# Patient Record
Sex: Female | Born: 1940 | Race: White | Hispanic: No | Smoking: Former smoker
Health system: Southern US, Community
[De-identification: ages and names within clinical notes are randomized; demographics above are authoritative.]

## PROBLEM LIST (undated history)

## (undated) DIAGNOSIS — K5130 Ulcerative (chronic) rectosigmoiditis without complications: Secondary | ICD-10-CM

## (undated) DIAGNOSIS — K5190 Ulcerative colitis, unspecified, without complications: Secondary | ICD-10-CM

## (undated) DIAGNOSIS — I10 Essential (primary) hypertension: Secondary | ICD-10-CM

## (undated) DIAGNOSIS — F329 Major depressive disorder, single episode, unspecified: Secondary | ICD-10-CM

## (undated) DIAGNOSIS — K760 Fatty (change of) liver, not elsewhere classified: Secondary | ICD-10-CM

## (undated) DIAGNOSIS — F32A Depression, unspecified: Secondary | ICD-10-CM

## (undated) DIAGNOSIS — K589 Irritable bowel syndrome without diarrhea: Secondary | ICD-10-CM

## (undated) DIAGNOSIS — R519 Headache, unspecified: Secondary | ICD-10-CM

## (undated) DIAGNOSIS — A045 Campylobacter enteritis: Secondary | ICD-10-CM

## (undated) DIAGNOSIS — I639 Cerebral infarction, unspecified: Secondary | ICD-10-CM

## (undated) DIAGNOSIS — M1991 Primary osteoarthritis, unspecified site: Secondary | ICD-10-CM

## (undated) DIAGNOSIS — N951 Menopausal and female climacteric states: Secondary | ICD-10-CM

## (undated) DIAGNOSIS — E785 Hyperlipidemia, unspecified: Secondary | ICD-10-CM

## (undated) DIAGNOSIS — T7840XA Allergy, unspecified, initial encounter: Secondary | ICD-10-CM

## (undated) DIAGNOSIS — R87610 Atypical squamous cells of undetermined significance on cytologic smear of cervix (ASC-US): Secondary | ICD-10-CM

## (undated) DIAGNOSIS — J45909 Unspecified asthma, uncomplicated: Secondary | ICD-10-CM

## (undated) DIAGNOSIS — J3089 Other allergic rhinitis: Secondary | ICD-10-CM

## (undated) DIAGNOSIS — Z800 Family history of malignant neoplasm of digestive organs: Secondary | ICD-10-CM

## (undated) DIAGNOSIS — K219 Gastro-esophageal reflux disease without esophagitis: Secondary | ICD-10-CM

## (undated) DIAGNOSIS — G4730 Sleep apnea, unspecified: Secondary | ICD-10-CM

## (undated) DIAGNOSIS — A0472 Enterocolitis due to Clostridium difficile, not specified as recurrent: Secondary | ICD-10-CM

## (undated) DIAGNOSIS — H269 Unspecified cataract: Secondary | ICD-10-CM

## (undated) DIAGNOSIS — J449 Chronic obstructive pulmonary disease, unspecified: Secondary | ICD-10-CM

## (undated) DIAGNOSIS — G8929 Other chronic pain: Secondary | ICD-10-CM

## (undated) HISTORY — DX: Headache, unspecified: R51.9

## (undated) HISTORY — DX: Chronic obstructive pulmonary disease, unspecified: J44.9

## (undated) HISTORY — DX: Fatty (change of) liver, not elsewhere classified: K76.0

## (undated) HISTORY — DX: Family history of malignant neoplasm of digestive organs: Z80.0

## (undated) HISTORY — DX: Depression, unspecified: F32.A

## (undated) HISTORY — PX: COLONOSCOPY: SHX174

## (undated) HISTORY — DX: Ulcerative (chronic) rectosigmoiditis without complications: K51.30

## (undated) HISTORY — DX: Campylobacter enteritis: A04.5

## (undated) HISTORY — DX: Irritable bowel syndrome, unspecified: K58.9

## (undated) HISTORY — DX: Gastro-esophageal reflux disease without esophagitis: K21.9

## (undated) HISTORY — PX: CHOLECYSTECTOMY: SHX55

## (undated) HISTORY — DX: Ulcerative colitis, unspecified, without complications: K51.90

## (undated) HISTORY — DX: Allergy, unspecified, initial encounter: T78.40XA

## (undated) HISTORY — DX: Unspecified asthma, uncomplicated: J45.909

## (undated) HISTORY — DX: Primary osteoarthritis, unspecified site: M19.91

## (undated) HISTORY — DX: Sleep apnea, unspecified: G47.30

## (undated) HISTORY — DX: Menopausal and female climacteric states: N95.1

## (undated) HISTORY — DX: Other chronic pain: G89.29

## (undated) HISTORY — DX: Other allergic rhinitis: J30.89

## (undated) HISTORY — PX: UPPER GASTROINTESTINAL ENDOSCOPY: SHX188

## (undated) HISTORY — DX: Hyperlipidemia, unspecified: E78.5

## (undated) HISTORY — PX: REPLACEMENT TOTAL KNEE: SUR1224

## (undated) HISTORY — PX: POLYPECTOMY: SHX149

## (undated) HISTORY — DX: Enterocolitis due to Clostridium difficile, not specified as recurrent: A04.72

## (undated) HISTORY — DX: Atypical squamous cells of undetermined significance on cytologic smear of cervix (ASC-US): R87.610

## (undated) HISTORY — DX: Morbid (severe) obesity due to excess calories: E66.01

## (undated) HISTORY — PX: TUBAL LIGATION: SHX77

## (undated) HISTORY — DX: Major depressive disorder, single episode, unspecified: F32.9

## (undated) HISTORY — DX: Essential (primary) hypertension: I10

## (undated) HISTORY — DX: Unspecified cataract: H26.9

## (undated) HISTORY — DX: Cerebral infarction, unspecified: I63.9

## (undated) HISTORY — PX: CATARACT EXTRACTION: SUR2

---

## 2008-03-09 ENCOUNTER — Ambulatory Visit: Payer: Self-pay | Admitting: Internal Medicine

## 2008-03-09 DIAGNOSIS — G4730 Sleep apnea, unspecified: Secondary | ICD-10-CM | POA: Insufficient documentation

## 2008-03-30 ENCOUNTER — Encounter: Payer: Self-pay | Admitting: Internal Medicine

## 2008-03-30 ENCOUNTER — Ambulatory Visit (HOSPITAL_BASED_OUTPATIENT_CLINIC_OR_DEPARTMENT_OTHER): Admission: RE | Admit: 2008-03-30 | Discharge: 2008-03-30 | Payer: Self-pay | Admitting: Internal Medicine

## 2008-04-04 ENCOUNTER — Ambulatory Visit: Payer: Self-pay | Admitting: Internal Medicine

## 2008-04-13 ENCOUNTER — Ambulatory Visit: Payer: Self-pay | Admitting: Internal Medicine

## 2008-04-25 ENCOUNTER — Encounter: Payer: Self-pay | Admitting: Internal Medicine

## 2008-04-29 ENCOUNTER — Encounter: Payer: Self-pay | Admitting: Internal Medicine

## 2008-05-01 ENCOUNTER — Encounter: Payer: Self-pay | Admitting: Internal Medicine

## 2008-05-05 ENCOUNTER — Encounter: Payer: Self-pay | Admitting: Internal Medicine

## 2008-05-25 ENCOUNTER — Ambulatory Visit: Payer: Self-pay | Admitting: Internal Medicine

## 2008-07-06 ENCOUNTER — Encounter: Payer: Self-pay | Admitting: Internal Medicine

## 2008-08-24 ENCOUNTER — Ambulatory Visit: Payer: Self-pay | Admitting: Internal Medicine

## 2009-02-22 ENCOUNTER — Ambulatory Visit: Payer: Self-pay | Admitting: Internal Medicine

## 2010-02-23 ENCOUNTER — Encounter: Payer: Self-pay | Admitting: Internal Medicine

## 2010-10-11 NOTE — Letter (Signed)
Summary: CMN for PAP Supplies/[facility]  CMN for PAP Supplies/[facility]   Imported By: Sherian Rein 03/16/2010 13:36:25  _____________________________________________________________________  External Attachment:    Type:   Image     Comment:   External Document

## 2011-01-24 NOTE — Procedures (Signed)
Emily Hancock, Emily Hancock                ACCOUNT NO.:  [id number]   MEDICAL RECORD NO.:  [id number]          PATIENT TYPE:  OUT   LOCATION:  SLEEP CENTER                 FACILITY:  [facility]   PHYSICIAN:  Clinton D. Maple Hudson, MD, FCCP, FACPDATE OF BIRTH:  May 14[birth year] 1942   DATE OF STUDY:  03/30/2008                            NOCTURNAL POLYSOMNOGRAM   REFERRING PHYSICIAN:  Clinton D. Young, MD, FCCP, FACP   INDICATION FOR STUDY:  Hypersomnia with sleep apnea.   EPWORTH SLEEPINESS SCORE:  11/24.  BMI 33, weight 199 pounds, height 63  inches, 15 inches.   MEDICATIONS:  Home medication charted and reviewed.   SLEEP ARCHITECTURE:  Total sleep time 277.5 minutes with sleep  efficiency 68.4%.  Stage I was 6.1%.  Stage II 84.9%.  Stage III 1.3%.  REM 7.7% of total sleep time.  Sleep latency 91.5 minutes.  REM latency  11.5 minutes.  Awake after sleep onset 36.5 minutes.  Arousal index  32.9.  Only Simvastatin taken at bedtime.   RESPIRATORY DATA:  Apnea/hypopnea index (AHI) 4.1 per hour.  Respiratory  disturbance index (RDI) 17.1 per hour with RERA count 60, index 13.  Nineteen events were scored including 2 obstructive apneas and 17  hypopneas.  All events occurred while supine.  REM AHI 5.6.  There were  insufficient events to permit CPAP titration by split protocol on this  study night.   OXYGEN DATA:  Moderate snoring with oxygen desaturation to a nadir of  84%.  Mean oxygen saturation through the study was 93.1% on room air.   CARDIAC DATA:  Normal sinus rhythm with occasional PVC.   MOVEMENT-PARASOMNIA:  Frequent limb jerks with a total of 209 counted,  averaging 45 per hour.  Of these, 29 were periodic limb movement with  arousal averaging 4.5 per hour.  No bathroom trips.   IMPRESSIONS-RECOMMENDATIONS:  1. Mild obstructive sleep apnea/hypopnea syndrome based on RDI 17.1      per hour, AHI 4.1 per hour (normal range 0-5 per hour).  All events      were associated with supine sleep, moderate  snoring with oxygen      desaturation to a nadir of 84%.  2. Scores in this range might be considered for a trial of CPAP if      conservative measures are insufficient.      Consider return for CPAP titration or evaluate for alternative      management as appropriate.  3. Periodic limb movement with arousal, 21 events, index 4.5 per hour.      Clinton D. Maple Hudson, MD, [facility], FACP  Diplomate, Biomedical engineer of Sleep Medicine  Electronically Signed     CDY/MEDQ  D:  04/04/2008 11:35:43  T:  04/04/2008 12:32:48  Job:  [id number]

## 2012-02-08 DIAGNOSIS — N951 Menopausal and female climacteric states: Secondary | ICD-10-CM | POA: Diagnosis not present

## 2012-02-08 DIAGNOSIS — M25569 Pain in unspecified knee: Secondary | ICD-10-CM | POA: Diagnosis not present

## 2012-02-08 DIAGNOSIS — R3915 Urgency of urination: Secondary | ICD-10-CM | POA: Diagnosis not present

## 2012-02-08 DIAGNOSIS — B351 Tinea unguium: Secondary | ICD-10-CM | POA: Diagnosis not present

## 2012-07-08 DIAGNOSIS — Z23 Encounter for immunization: Secondary | ICD-10-CM | POA: Diagnosis not present

## 2012-07-12 DIAGNOSIS — F329 Major depressive disorder, single episode, unspecified: Secondary | ICD-10-CM | POA: Diagnosis not present

## 2012-07-12 DIAGNOSIS — E7800 Pure hypercholesterolemia, unspecified: Secondary | ICD-10-CM | POA: Diagnosis not present

## 2012-07-12 DIAGNOSIS — N951 Menopausal and female climacteric states: Secondary | ICD-10-CM | POA: Diagnosis not present

## 2012-07-12 DIAGNOSIS — Z79899 Other long term (current) drug therapy: Secondary | ICD-10-CM | POA: Diagnosis not present

## 2012-08-30 DIAGNOSIS — Z800 Family history of malignant neoplasm of digestive organs: Secondary | ICD-10-CM | POA: Diagnosis not present

## 2012-08-30 DIAGNOSIS — R232 Flushing: Secondary | ICD-10-CM | POA: Diagnosis not present

## 2012-08-30 DIAGNOSIS — Z801 Family history of malignant neoplasm of trachea, bronchus and lung: Secondary | ICD-10-CM | POA: Diagnosis not present

## 2012-08-30 DIAGNOSIS — Z87891 Personal history of nicotine dependence: Secondary | ICD-10-CM | POA: Diagnosis not present

## 2012-09-05 DIAGNOSIS — K5130 Ulcerative (chronic) rectosigmoiditis without complications: Secondary | ICD-10-CM | POA: Diagnosis not present

## 2013-01-02 DIAGNOSIS — R232 Flushing: Secondary | ICD-10-CM | POA: Diagnosis not present

## 2013-01-03 DIAGNOSIS — J019 Acute sinusitis, unspecified: Secondary | ICD-10-CM | POA: Diagnosis not present

## 2013-01-03 DIAGNOSIS — M25569 Pain in unspecified knee: Secondary | ICD-10-CM | POA: Diagnosis not present

## 2013-01-03 DIAGNOSIS — Z0000 Encounter for general adult medical examination without abnormal findings: Secondary | ICD-10-CM | POA: Diagnosis not present

## 2013-01-10 DIAGNOSIS — M25569 Pain in unspecified knee: Secondary | ICD-10-CM | POA: Diagnosis not present

## 2013-06-02 DIAGNOSIS — H04129 Dry eye syndrome of unspecified lacrimal gland: Secondary | ICD-10-CM | POA: Diagnosis not present

## 2013-06-02 DIAGNOSIS — Z961 Presence of intraocular lens: Secondary | ICD-10-CM | POA: Diagnosis not present

## 2013-06-02 DIAGNOSIS — H43819 Vitreous degeneration, unspecified eye: Secondary | ICD-10-CM | POA: Diagnosis not present

## 2013-07-11 DIAGNOSIS — M25569 Pain in unspecified knee: Secondary | ICD-10-CM | POA: Diagnosis not present

## 2013-07-14 DIAGNOSIS — Z23 Encounter for immunization: Secondary | ICD-10-CM | POA: Diagnosis not present

## 2013-07-18 DIAGNOSIS — M25569 Pain in unspecified knee: Secondary | ICD-10-CM | POA: Diagnosis not present

## 2013-07-21 DIAGNOSIS — K219 Gastro-esophageal reflux disease without esophagitis: Secondary | ICD-10-CM | POA: Diagnosis not present

## 2013-07-21 DIAGNOSIS — Z006 Encounter for examination for normal comparison and control in clinical research program: Secondary | ICD-10-CM | POA: Diagnosis not present

## 2013-07-21 DIAGNOSIS — E7800 Pure hypercholesterolemia, unspecified: Secondary | ICD-10-CM | POA: Diagnosis not present

## 2013-07-21 DIAGNOSIS — J45909 Unspecified asthma, uncomplicated: Secondary | ICD-10-CM | POA: Diagnosis not present

## 2013-08-15 DIAGNOSIS — K219 Gastro-esophageal reflux disease without esophagitis: Secondary | ICD-10-CM | POA: Diagnosis not present

## 2013-08-15 DIAGNOSIS — E669 Obesity, unspecified: Secondary | ICD-10-CM | POA: Diagnosis not present

## 2013-08-15 DIAGNOSIS — F329 Major depressive disorder, single episode, unspecified: Secondary | ICD-10-CM | POA: Diagnosis not present

## 2013-08-15 DIAGNOSIS — Z79899 Other long term (current) drug therapy: Secondary | ICD-10-CM | POA: Diagnosis not present

## 2013-08-15 DIAGNOSIS — E7800 Pure hypercholesterolemia, unspecified: Secondary | ICD-10-CM | POA: Diagnosis not present

## 2013-08-15 DIAGNOSIS — N951 Menopausal and female climacteric states: Secondary | ICD-10-CM | POA: Diagnosis not present

## 2013-09-16 DIAGNOSIS — H1030 Unspecified acute conjunctivitis, unspecified eye: Secondary | ICD-10-CM | POA: Diagnosis not present

## 2013-09-16 DIAGNOSIS — N3941 Urge incontinence: Secondary | ICD-10-CM | POA: Diagnosis not present

## 2013-09-16 DIAGNOSIS — L719 Rosacea, unspecified: Secondary | ICD-10-CM | POA: Diagnosis not present

## 2013-10-14 DIAGNOSIS — K5130 Ulcerative (chronic) rectosigmoiditis without complications: Secondary | ICD-10-CM | POA: Diagnosis not present

## 2013-10-16 DIAGNOSIS — K5130 Ulcerative (chronic) rectosigmoiditis without complications: Secondary | ICD-10-CM | POA: Diagnosis not present

## 2013-10-21 DIAGNOSIS — H01029 Squamous blepharitis unspecified eye, unspecified eyelid: Secondary | ICD-10-CM | POA: Diagnosis not present

## 2013-10-21 DIAGNOSIS — M1710 Unilateral primary osteoarthritis, unspecified knee: Secondary | ICD-10-CM | POA: Diagnosis not present

## 2013-10-21 DIAGNOSIS — H0019 Chalazion unspecified eye, unspecified eyelid: Secondary | ICD-10-CM | POA: Diagnosis not present

## 2013-10-29 DIAGNOSIS — M1710 Unilateral primary osteoarthritis, unspecified knee: Secondary | ICD-10-CM | POA: Diagnosis not present

## 2013-11-05 DIAGNOSIS — M1710 Unilateral primary osteoarthritis, unspecified knee: Secondary | ICD-10-CM | POA: Diagnosis not present

## 2013-11-13 DIAGNOSIS — M1710 Unilateral primary osteoarthritis, unspecified knee: Secondary | ICD-10-CM | POA: Diagnosis not present

## 2014-01-30 DIAGNOSIS — L719 Rosacea, unspecified: Secondary | ICD-10-CM | POA: Diagnosis not present

## 2014-01-30 DIAGNOSIS — Z23 Encounter for immunization: Secondary | ICD-10-CM | POA: Diagnosis not present

## 2014-03-06 DIAGNOSIS — M25569 Pain in unspecified knee: Secondary | ICD-10-CM | POA: Diagnosis not present

## 2014-03-06 DIAGNOSIS — M79609 Pain in unspecified limb: Secondary | ICD-10-CM | POA: Diagnosis not present

## 2014-03-06 DIAGNOSIS — M169 Osteoarthritis of hip, unspecified: Secondary | ICD-10-CM | POA: Diagnosis not present

## 2014-03-06 DIAGNOSIS — M1610 Unilateral primary osteoarthritis, unspecified hip: Secondary | ICD-10-CM | POA: Diagnosis not present

## 2014-03-06 DIAGNOSIS — M25469 Effusion, unspecified knee: Secondary | ICD-10-CM | POA: Diagnosis not present

## 2014-03-06 DIAGNOSIS — IMO0002 Reserved for concepts with insufficient information to code with codable children: Secondary | ICD-10-CM | POA: Diagnosis not present

## 2014-03-06 DIAGNOSIS — M1710 Unilateral primary osteoarthritis, unspecified knee: Secondary | ICD-10-CM | POA: Diagnosis not present

## 2014-06-08 DIAGNOSIS — R413 Other amnesia: Secondary | ICD-10-CM | POA: Diagnosis not present

## 2014-06-08 DIAGNOSIS — D539 Nutritional anemia, unspecified: Secondary | ICD-10-CM | POA: Diagnosis not present

## 2014-06-08 DIAGNOSIS — R32 Unspecified urinary incontinence: Secondary | ICD-10-CM | POA: Diagnosis not present

## 2014-06-08 DIAGNOSIS — Z23 Encounter for immunization: Secondary | ICD-10-CM | POA: Diagnosis not present

## 2014-06-12 DIAGNOSIS — R413 Other amnesia: Secondary | ICD-10-CM | POA: Diagnosis not present

## 2014-06-12 DIAGNOSIS — G319 Degenerative disease of nervous system, unspecified: Secondary | ICD-10-CM | POA: Diagnosis not present

## 2014-06-22 DIAGNOSIS — M1712 Unilateral primary osteoarthritis, left knee: Secondary | ICD-10-CM | POA: Diagnosis not present

## 2014-06-22 DIAGNOSIS — M25562 Pain in left knee: Secondary | ICD-10-CM | POA: Diagnosis not present

## 2014-06-25 DIAGNOSIS — Z79899 Other long term (current) drug therapy: Secondary | ICD-10-CM | POA: Diagnosis not present

## 2014-06-25 DIAGNOSIS — Z01818 Encounter for other preprocedural examination: Secondary | ICD-10-CM | POA: Diagnosis not present

## 2014-06-25 DIAGNOSIS — Z01812 Encounter for preprocedural laboratory examination: Secondary | ICD-10-CM | POA: Diagnosis not present

## 2014-06-25 DIAGNOSIS — I10 Essential (primary) hypertension: Secondary | ICD-10-CM | POA: Diagnosis not present

## 2014-06-25 DIAGNOSIS — M25562 Pain in left knee: Secondary | ICD-10-CM | POA: Diagnosis not present

## 2014-06-25 DIAGNOSIS — M1712 Unilateral primary osteoarthritis, left knee: Secondary | ICD-10-CM | POA: Diagnosis not present

## 2014-06-25 DIAGNOSIS — M79609 Pain in unspecified limb: Secondary | ICD-10-CM | POA: Diagnosis not present

## 2014-06-25 DIAGNOSIS — Z01810 Encounter for preprocedural cardiovascular examination: Secondary | ICD-10-CM | POA: Diagnosis not present

## 2014-06-29 DIAGNOSIS — Z01818 Encounter for other preprocedural examination: Secondary | ICD-10-CM | POA: Diagnosis not present

## 2014-07-01 DIAGNOSIS — M1711 Unilateral primary osteoarthritis, right knee: Secondary | ICD-10-CM | POA: Diagnosis not present

## 2014-07-09 DIAGNOSIS — L719 Rosacea, unspecified: Secondary | ICD-10-CM | POA: Diagnosis not present

## 2014-07-14 DIAGNOSIS — E780 Pure hypercholesterolemia: Secondary | ICD-10-CM | POA: Diagnosis present

## 2014-07-14 DIAGNOSIS — I10 Essential (primary) hypertension: Secondary | ICD-10-CM | POA: Diagnosis not present

## 2014-07-14 DIAGNOSIS — F329 Major depressive disorder, single episode, unspecified: Secondary | ICD-10-CM | POA: Diagnosis not present

## 2014-07-14 DIAGNOSIS — J449 Chronic obstructive pulmonary disease, unspecified: Secondary | ICD-10-CM | POA: Diagnosis present

## 2014-07-14 DIAGNOSIS — G4730 Sleep apnea, unspecified: Secondary | ICD-10-CM | POA: Diagnosis present

## 2014-07-14 DIAGNOSIS — Z96652 Presence of left artificial knee joint: Secondary | ICD-10-CM | POA: Diagnosis not present

## 2014-07-14 DIAGNOSIS — Z471 Aftercare following joint replacement surgery: Secondary | ICD-10-CM | POA: Diagnosis not present

## 2014-07-14 DIAGNOSIS — M179 Osteoarthritis of knee, unspecified: Secondary | ICD-10-CM | POA: Diagnosis not present

## 2014-07-14 DIAGNOSIS — Z8669 Personal history of other diseases of the nervous system and sense organs: Secondary | ICD-10-CM | POA: Diagnosis not present

## 2014-07-14 DIAGNOSIS — Z79899 Other long term (current) drug therapy: Secondary | ICD-10-CM | POA: Diagnosis not present

## 2014-07-14 DIAGNOSIS — K219 Gastro-esophageal reflux disease without esophagitis: Secondary | ICD-10-CM | POA: Diagnosis not present

## 2014-07-14 DIAGNOSIS — G4733 Obstructive sleep apnea (adult) (pediatric): Secondary | ICD-10-CM | POA: Diagnosis not present

## 2014-07-14 DIAGNOSIS — M1712 Unilateral primary osteoarthritis, left knee: Secondary | ICD-10-CM | POA: Diagnosis not present

## 2014-07-14 DIAGNOSIS — Z7982 Long term (current) use of aspirin: Secondary | ICD-10-CM | POA: Diagnosis not present

## 2014-07-14 HISTORY — PX: REPLACEMENT TOTAL KNEE: SUR1224

## 2014-07-17 DIAGNOSIS — R262 Difficulty in walking, not elsewhere classified: Secondary | ICD-10-CM | POA: Diagnosis not present

## 2014-07-17 DIAGNOSIS — M6281 Muscle weakness (generalized): Secondary | ICD-10-CM | POA: Diagnosis not present

## 2014-07-19 DIAGNOSIS — M6281 Muscle weakness (generalized): Secondary | ICD-10-CM | POA: Diagnosis not present

## 2014-07-19 DIAGNOSIS — R262 Difficulty in walking, not elsewhere classified: Secondary | ICD-10-CM | POA: Diagnosis not present

## 2014-07-20 DIAGNOSIS — R262 Difficulty in walking, not elsewhere classified: Secondary | ICD-10-CM | POA: Diagnosis not present

## 2014-07-20 DIAGNOSIS — M6281 Muscle weakness (generalized): Secondary | ICD-10-CM | POA: Diagnosis not present

## 2014-07-21 DIAGNOSIS — M6281 Muscle weakness (generalized): Secondary | ICD-10-CM | POA: Diagnosis not present

## 2014-07-21 DIAGNOSIS — R262 Difficulty in walking, not elsewhere classified: Secondary | ICD-10-CM | POA: Diagnosis not present

## 2014-07-22 DIAGNOSIS — R262 Difficulty in walking, not elsewhere classified: Secondary | ICD-10-CM | POA: Diagnosis not present

## 2014-07-22 DIAGNOSIS — M6281 Muscle weakness (generalized): Secondary | ICD-10-CM | POA: Diagnosis not present

## 2014-07-23 DIAGNOSIS — M6281 Muscle weakness (generalized): Secondary | ICD-10-CM | POA: Diagnosis not present

## 2014-07-23 DIAGNOSIS — R262 Difficulty in walking, not elsewhere classified: Secondary | ICD-10-CM | POA: Diagnosis not present

## 2014-07-24 DIAGNOSIS — M6281 Muscle weakness (generalized): Secondary | ICD-10-CM | POA: Diagnosis not present

## 2014-07-24 DIAGNOSIS — R262 Difficulty in walking, not elsewhere classified: Secondary | ICD-10-CM | POA: Diagnosis not present

## 2014-07-26 DIAGNOSIS — R262 Difficulty in walking, not elsewhere classified: Secondary | ICD-10-CM | POA: Diagnosis not present

## 2014-07-26 DIAGNOSIS — M6281 Muscle weakness (generalized): Secondary | ICD-10-CM | POA: Diagnosis not present

## 2014-07-28 DIAGNOSIS — M25562 Pain in left knee: Secondary | ICD-10-CM | POA: Diagnosis not present

## 2014-07-28 DIAGNOSIS — M6281 Muscle weakness (generalized): Secondary | ICD-10-CM | POA: Diagnosis not present

## 2014-07-28 DIAGNOSIS — M1712 Unilateral primary osteoarthritis, left knee: Secondary | ICD-10-CM | POA: Diagnosis not present

## 2014-07-28 DIAGNOSIS — R2689 Other abnormalities of gait and mobility: Secondary | ICD-10-CM | POA: Diagnosis not present

## 2014-07-30 DIAGNOSIS — M6281 Muscle weakness (generalized): Secondary | ICD-10-CM | POA: Diagnosis not present

## 2014-07-30 DIAGNOSIS — M25562 Pain in left knee: Secondary | ICD-10-CM | POA: Diagnosis not present

## 2014-07-30 DIAGNOSIS — M1712 Unilateral primary osteoarthritis, left knee: Secondary | ICD-10-CM | POA: Diagnosis not present

## 2014-07-30 DIAGNOSIS — R2689 Other abnormalities of gait and mobility: Secondary | ICD-10-CM | POA: Diagnosis not present

## 2014-08-03 DIAGNOSIS — M25562 Pain in left knee: Secondary | ICD-10-CM | POA: Diagnosis not present

## 2014-08-03 DIAGNOSIS — R2689 Other abnormalities of gait and mobility: Secondary | ICD-10-CM | POA: Diagnosis not present

## 2014-08-03 DIAGNOSIS — M1712 Unilateral primary osteoarthritis, left knee: Secondary | ICD-10-CM | POA: Diagnosis not present

## 2014-08-03 DIAGNOSIS — M6281 Muscle weakness (generalized): Secondary | ICD-10-CM | POA: Diagnosis not present

## 2014-08-05 DIAGNOSIS — M25562 Pain in left knee: Secondary | ICD-10-CM | POA: Diagnosis not present

## 2014-08-05 DIAGNOSIS — M1712 Unilateral primary osteoarthritis, left knee: Secondary | ICD-10-CM | POA: Diagnosis not present

## 2014-08-05 DIAGNOSIS — R2689 Other abnormalities of gait and mobility: Secondary | ICD-10-CM | POA: Diagnosis not present

## 2014-08-05 DIAGNOSIS — M6281 Muscle weakness (generalized): Secondary | ICD-10-CM | POA: Diagnosis not present

## 2014-08-11 DIAGNOSIS — M6281 Muscle weakness (generalized): Secondary | ICD-10-CM | POA: Diagnosis not present

## 2014-08-11 DIAGNOSIS — M1712 Unilateral primary osteoarthritis, left knee: Secondary | ICD-10-CM | POA: Diagnosis not present

## 2014-08-11 DIAGNOSIS — M25562 Pain in left knee: Secondary | ICD-10-CM | POA: Diagnosis not present

## 2014-08-11 DIAGNOSIS — R2689 Other abnormalities of gait and mobility: Secondary | ICD-10-CM | POA: Diagnosis not present

## 2014-08-13 DIAGNOSIS — M1712 Unilateral primary osteoarthritis, left knee: Secondary | ICD-10-CM | POA: Diagnosis not present

## 2014-08-13 DIAGNOSIS — M25562 Pain in left knee: Secondary | ICD-10-CM | POA: Diagnosis not present

## 2014-08-13 DIAGNOSIS — R2689 Other abnormalities of gait and mobility: Secondary | ICD-10-CM | POA: Diagnosis not present

## 2014-08-13 DIAGNOSIS — M6281 Muscle weakness (generalized): Secondary | ICD-10-CM | POA: Diagnosis not present

## 2014-08-17 DIAGNOSIS — M1712 Unilateral primary osteoarthritis, left knee: Secondary | ICD-10-CM | POA: Diagnosis not present

## 2014-08-17 DIAGNOSIS — R2689 Other abnormalities of gait and mobility: Secondary | ICD-10-CM | POA: Diagnosis not present

## 2014-08-17 DIAGNOSIS — M25562 Pain in left knee: Secondary | ICD-10-CM | POA: Diagnosis not present

## 2014-08-17 DIAGNOSIS — M6281 Muscle weakness (generalized): Secondary | ICD-10-CM | POA: Diagnosis not present

## 2014-08-20 DIAGNOSIS — M1712 Unilateral primary osteoarthritis, left knee: Secondary | ICD-10-CM | POA: Diagnosis not present

## 2014-08-20 DIAGNOSIS — M25562 Pain in left knee: Secondary | ICD-10-CM | POA: Diagnosis not present

## 2014-08-20 DIAGNOSIS — Z96652 Presence of left artificial knee joint: Secondary | ICD-10-CM | POA: Diagnosis not present

## 2014-08-20 DIAGNOSIS — R2689 Other abnormalities of gait and mobility: Secondary | ICD-10-CM | POA: Diagnosis not present

## 2014-08-20 DIAGNOSIS — M6281 Muscle weakness (generalized): Secondary | ICD-10-CM | POA: Diagnosis not present

## 2014-08-26 DIAGNOSIS — M25562 Pain in left knee: Secondary | ICD-10-CM | POA: Diagnosis not present

## 2014-08-26 DIAGNOSIS — M1712 Unilateral primary osteoarthritis, left knee: Secondary | ICD-10-CM | POA: Diagnosis not present

## 2014-08-26 DIAGNOSIS — R2689 Other abnormalities of gait and mobility: Secondary | ICD-10-CM | POA: Diagnosis not present

## 2014-08-26 DIAGNOSIS — M6281 Muscle weakness (generalized): Secondary | ICD-10-CM | POA: Diagnosis not present

## 2014-08-28 DIAGNOSIS — M1712 Unilateral primary osteoarthritis, left knee: Secondary | ICD-10-CM | POA: Diagnosis not present

## 2014-08-28 DIAGNOSIS — M25562 Pain in left knee: Secondary | ICD-10-CM | POA: Diagnosis not present

## 2014-08-28 DIAGNOSIS — R2689 Other abnormalities of gait and mobility: Secondary | ICD-10-CM | POA: Diagnosis not present

## 2014-08-28 DIAGNOSIS — M6281 Muscle weakness (generalized): Secondary | ICD-10-CM | POA: Diagnosis not present

## 2014-08-31 DIAGNOSIS — M6281 Muscle weakness (generalized): Secondary | ICD-10-CM | POA: Diagnosis not present

## 2014-08-31 DIAGNOSIS — R2689 Other abnormalities of gait and mobility: Secondary | ICD-10-CM | POA: Diagnosis not present

## 2014-08-31 DIAGNOSIS — M25562 Pain in left knee: Secondary | ICD-10-CM | POA: Diagnosis not present

## 2014-08-31 DIAGNOSIS — M1712 Unilateral primary osteoarthritis, left knee: Secondary | ICD-10-CM | POA: Diagnosis not present

## 2014-09-07 DIAGNOSIS — M1712 Unilateral primary osteoarthritis, left knee: Secondary | ICD-10-CM | POA: Diagnosis not present

## 2014-09-07 DIAGNOSIS — R2689 Other abnormalities of gait and mobility: Secondary | ICD-10-CM | POA: Diagnosis not present

## 2014-09-07 DIAGNOSIS — M6281 Muscle weakness (generalized): Secondary | ICD-10-CM | POA: Diagnosis not present

## 2014-09-07 DIAGNOSIS — M25562 Pain in left knee: Secondary | ICD-10-CM | POA: Diagnosis not present

## 2014-09-09 DIAGNOSIS — M25562 Pain in left knee: Secondary | ICD-10-CM | POA: Diagnosis not present

## 2014-09-09 DIAGNOSIS — R2689 Other abnormalities of gait and mobility: Secondary | ICD-10-CM | POA: Diagnosis not present

## 2014-09-09 DIAGNOSIS — M1712 Unilateral primary osteoarthritis, left knee: Secondary | ICD-10-CM | POA: Diagnosis not present

## 2014-09-09 DIAGNOSIS — M6281 Muscle weakness (generalized): Secondary | ICD-10-CM | POA: Diagnosis not present

## 2014-09-14 DIAGNOSIS — M25562 Pain in left knee: Secondary | ICD-10-CM | POA: Diagnosis not present

## 2014-09-14 DIAGNOSIS — R2689 Other abnormalities of gait and mobility: Secondary | ICD-10-CM | POA: Diagnosis not present

## 2014-09-14 DIAGNOSIS — M1712 Unilateral primary osteoarthritis, left knee: Secondary | ICD-10-CM | POA: Diagnosis not present

## 2014-09-14 DIAGNOSIS — M6281 Muscle weakness (generalized): Secondary | ICD-10-CM | POA: Diagnosis not present

## 2014-09-23 DIAGNOSIS — R2689 Other abnormalities of gait and mobility: Secondary | ICD-10-CM | POA: Diagnosis not present

## 2014-09-23 DIAGNOSIS — M6281 Muscle weakness (generalized): Secondary | ICD-10-CM | POA: Diagnosis not present

## 2014-09-23 DIAGNOSIS — M25562 Pain in left knee: Secondary | ICD-10-CM | POA: Diagnosis not present

## 2014-09-23 DIAGNOSIS — M1712 Unilateral primary osteoarthritis, left knee: Secondary | ICD-10-CM | POA: Diagnosis not present

## 2014-10-26 DIAGNOSIS — K219 Gastro-esophageal reflux disease without esophagitis: Secondary | ICD-10-CM | POA: Diagnosis not present

## 2014-10-26 DIAGNOSIS — F329 Major depressive disorder, single episode, unspecified: Secondary | ICD-10-CM | POA: Diagnosis not present

## 2014-10-26 DIAGNOSIS — R32 Unspecified urinary incontinence: Secondary | ICD-10-CM | POA: Diagnosis not present

## 2014-10-26 DIAGNOSIS — Z0000 Encounter for general adult medical examination without abnormal findings: Secondary | ICD-10-CM | POA: Diagnosis not present

## 2014-10-26 DIAGNOSIS — E780 Pure hypercholesterolemia: Secondary | ICD-10-CM | POA: Diagnosis not present

## 2015-01-01 DIAGNOSIS — Z961 Presence of intraocular lens: Secondary | ICD-10-CM | POA: Diagnosis not present

## 2015-01-01 DIAGNOSIS — H11153 Pinguecula, bilateral: Secondary | ICD-10-CM | POA: Diagnosis not present

## 2015-01-14 DIAGNOSIS — Z96652 Presence of left artificial knee joint: Secondary | ICD-10-CM | POA: Diagnosis not present

## 2015-02-16 DIAGNOSIS — K580 Irritable bowel syndrome with diarrhea: Secondary | ICD-10-CM | POA: Diagnosis not present

## 2015-02-16 DIAGNOSIS — Z800 Family history of malignant neoplasm of digestive organs: Secondary | ICD-10-CM | POA: Diagnosis not present

## 2015-02-16 DIAGNOSIS — K5130 Ulcerative (chronic) rectosigmoiditis without complications: Secondary | ICD-10-CM | POA: Diagnosis not present

## 2015-03-29 DIAGNOSIS — K5130 Ulcerative (chronic) rectosigmoiditis without complications: Secondary | ICD-10-CM | POA: Diagnosis not present

## 2015-03-29 DIAGNOSIS — J449 Chronic obstructive pulmonary disease, unspecified: Secondary | ICD-10-CM | POA: Diagnosis not present

## 2015-03-29 DIAGNOSIS — K5150 Left sided colitis without complications: Secondary | ICD-10-CM | POA: Diagnosis not present

## 2015-03-29 DIAGNOSIS — J45909 Unspecified asthma, uncomplicated: Secondary | ICD-10-CM | POA: Diagnosis not present

## 2015-03-29 DIAGNOSIS — G4730 Sleep apnea, unspecified: Secondary | ICD-10-CM | POA: Diagnosis not present

## 2015-03-29 DIAGNOSIS — Z1211 Encounter for screening for malignant neoplasm of colon: Secondary | ICD-10-CM | POA: Diagnosis not present

## 2015-03-29 DIAGNOSIS — K5730 Diverticulosis of large intestine without perforation or abscess without bleeding: Secondary | ICD-10-CM | POA: Diagnosis not present

## 2015-03-29 DIAGNOSIS — D122 Benign neoplasm of ascending colon: Secondary | ICD-10-CM | POA: Diagnosis not present

## 2015-03-29 DIAGNOSIS — Z800 Family history of malignant neoplasm of digestive organs: Secondary | ICD-10-CM | POA: Diagnosis not present

## 2015-03-29 DIAGNOSIS — K219 Gastro-esophageal reflux disease without esophagitis: Secondary | ICD-10-CM | POA: Diagnosis not present

## 2015-03-29 DIAGNOSIS — Z87891 Personal history of nicotine dependence: Secondary | ICD-10-CM | POA: Diagnosis not present

## 2015-03-29 DIAGNOSIS — E785 Hyperlipidemia, unspecified: Secondary | ICD-10-CM | POA: Diagnosis not present

## 2015-03-29 DIAGNOSIS — K648 Other hemorrhoids: Secondary | ICD-10-CM | POA: Diagnosis not present

## 2015-04-30 DIAGNOSIS — A09 Infectious gastroenteritis and colitis, unspecified: Secondary | ICD-10-CM | POA: Diagnosis not present

## 2015-06-01 DIAGNOSIS — A09 Infectious gastroenteritis and colitis, unspecified: Secondary | ICD-10-CM | POA: Diagnosis not present

## 2015-06-28 DIAGNOSIS — Z23 Encounter for immunization: Secondary | ICD-10-CM | POA: Diagnosis not present

## 2015-06-30 DIAGNOSIS — A09 Infectious gastroenteritis and colitis, unspecified: Secondary | ICD-10-CM | POA: Diagnosis not present

## 2015-07-19 DIAGNOSIS — Z96652 Presence of left artificial knee joint: Secondary | ICD-10-CM | POA: Diagnosis not present

## 2015-08-13 DIAGNOSIS — B373 Candidiasis of vulva and vagina: Secondary | ICD-10-CM | POA: Diagnosis not present

## 2015-09-30 DIAGNOSIS — N9489 Other specified conditions associated with female genital organs and menstrual cycle: Secondary | ICD-10-CM | POA: Diagnosis not present

## 2015-09-30 DIAGNOSIS — N949 Unspecified condition associated with female genital organs and menstrual cycle: Secondary | ICD-10-CM | POA: Diagnosis not present

## 2015-09-30 DIAGNOSIS — N739 Female pelvic inflammatory disease, unspecified: Secondary | ICD-10-CM | POA: Diagnosis not present

## 2015-09-30 DIAGNOSIS — B373 Candidiasis of vulva and vagina: Secondary | ICD-10-CM | POA: Diagnosis not present

## 2015-09-30 DIAGNOSIS — B9689 Other specified bacterial agents as the cause of diseases classified elsewhere: Secondary | ICD-10-CM | POA: Diagnosis not present

## 2015-09-30 DIAGNOSIS — Z79899 Other long term (current) drug therapy: Secondary | ICD-10-CM | POA: Diagnosis not present

## 2015-09-30 DIAGNOSIS — N760 Acute vaginitis: Secondary | ICD-10-CM | POA: Diagnosis not present

## 2015-12-27 DIAGNOSIS — R509 Fever, unspecified: Secondary | ICD-10-CM | POA: Diagnosis not present

## 2015-12-27 DIAGNOSIS — R0600 Dyspnea, unspecified: Secondary | ICD-10-CM | POA: Diagnosis not present

## 2015-12-27 DIAGNOSIS — J209 Acute bronchitis, unspecified: Secondary | ICD-10-CM | POA: Diagnosis not present

## 2016-02-19 DIAGNOSIS — R0602 Shortness of breath: Secondary | ICD-10-CM | POA: Diagnosis not present

## 2016-02-19 DIAGNOSIS — R062 Wheezing: Secondary | ICD-10-CM | POA: Diagnosis not present

## 2016-02-19 DIAGNOSIS — J209 Acute bronchitis, unspecified: Secondary | ICD-10-CM | POA: Diagnosis not present

## 2016-02-19 DIAGNOSIS — A309 Leprosy, unspecified: Secondary | ICD-10-CM | POA: Diagnosis not present

## 2016-03-07 DIAGNOSIS — R0600 Dyspnea, unspecified: Secondary | ICD-10-CM | POA: Diagnosis not present

## 2016-03-07 DIAGNOSIS — J45909 Unspecified asthma, uncomplicated: Secondary | ICD-10-CM | POA: Diagnosis not present

## 2016-05-09 ENCOUNTER — Other Ambulatory Visit: Payer: Self-pay

## 2016-05-16 DIAGNOSIS — K5130 Ulcerative (chronic) rectosigmoiditis without complications: Secondary | ICD-10-CM | POA: Diagnosis not present

## 2016-06-05 DIAGNOSIS — J329 Chronic sinusitis, unspecified: Secondary | ICD-10-CM | POA: Diagnosis not present

## 2016-06-14 DIAGNOSIS — Z23 Encounter for immunization: Secondary | ICD-10-CM | POA: Diagnosis not present

## 2016-07-31 DIAGNOSIS — J329 Chronic sinusitis, unspecified: Secondary | ICD-10-CM | POA: Diagnosis not present

## 2016-07-31 DIAGNOSIS — Z0000 Encounter for general adult medical examination without abnormal findings: Secondary | ICD-10-CM | POA: Diagnosis not present

## 2016-07-31 DIAGNOSIS — F329 Major depressive disorder, single episode, unspecified: Secondary | ICD-10-CM | POA: Diagnosis not present

## 2016-07-31 DIAGNOSIS — Z1389 Encounter for screening for other disorder: Secondary | ICD-10-CM | POA: Diagnosis not present

## 2016-07-31 DIAGNOSIS — Z9181 History of falling: Secondary | ICD-10-CM | POA: Diagnosis not present

## 2016-10-27 DIAGNOSIS — F329 Major depressive disorder, single episode, unspecified: Secondary | ICD-10-CM | POA: Diagnosis not present

## 2017-01-29 DIAGNOSIS — R875 Abnormal microbiological findings in specimens from female genital organs: Secondary | ICD-10-CM | POA: Diagnosis not present

## 2017-01-29 DIAGNOSIS — Z01411 Encounter for gynecological examination (general) (routine) with abnormal findings: Secondary | ICD-10-CM | POA: Diagnosis not present

## 2017-01-29 DIAGNOSIS — Z124 Encounter for screening for malignant neoplasm of cervix: Secondary | ICD-10-CM | POA: Diagnosis not present

## 2017-01-29 DIAGNOSIS — B373 Candidiasis of vulva and vagina: Secondary | ICD-10-CM | POA: Diagnosis not present

## 2017-01-29 DIAGNOSIS — Z1231 Encounter for screening mammogram for malignant neoplasm of breast: Secondary | ICD-10-CM | POA: Diagnosis not present

## 2017-01-29 DIAGNOSIS — Z01419 Encounter for gynecological examination (general) (routine) without abnormal findings: Secondary | ICD-10-CM | POA: Diagnosis not present

## 2017-01-30 DIAGNOSIS — K5130 Ulcerative (chronic) rectosigmoiditis without complications: Secondary | ICD-10-CM | POA: Diagnosis not present

## 2017-01-30 DIAGNOSIS — R1310 Dysphagia, unspecified: Secondary | ICD-10-CM | POA: Diagnosis not present

## 2017-02-02 DIAGNOSIS — K224 Dyskinesia of esophagus: Secondary | ICD-10-CM | POA: Diagnosis not present

## 2017-02-02 DIAGNOSIS — K449 Diaphragmatic hernia without obstruction or gangrene: Secondary | ICD-10-CM | POA: Diagnosis not present

## 2017-02-02 DIAGNOSIS — R1310 Dysphagia, unspecified: Secondary | ICD-10-CM | POA: Diagnosis not present

## 2017-02-02 DIAGNOSIS — K225 Diverticulum of esophagus, acquired: Secondary | ICD-10-CM | POA: Diagnosis not present

## 2017-02-26 DIAGNOSIS — K222 Esophageal obstruction: Secondary | ICD-10-CM | POA: Diagnosis not present

## 2017-02-26 DIAGNOSIS — R1310 Dysphagia, unspecified: Secondary | ICD-10-CM | POA: Diagnosis not present

## 2017-02-26 DIAGNOSIS — D131 Benign neoplasm of stomach: Secondary | ICD-10-CM | POA: Diagnosis not present

## 2017-02-26 DIAGNOSIS — K449 Diaphragmatic hernia without obstruction or gangrene: Secondary | ICD-10-CM | POA: Diagnosis not present

## 2017-02-26 DIAGNOSIS — Z79899 Other long term (current) drug therapy: Secondary | ICD-10-CM | POA: Diagnosis not present

## 2017-02-26 DIAGNOSIS — J449 Chronic obstructive pulmonary disease, unspecified: Secondary | ICD-10-CM | POA: Diagnosis not present

## 2017-02-26 DIAGNOSIS — Z800 Family history of malignant neoplasm of digestive organs: Secondary | ICD-10-CM | POA: Diagnosis not present

## 2017-02-26 DIAGNOSIS — K228 Other specified diseases of esophagus: Secondary | ICD-10-CM | POA: Diagnosis not present

## 2017-02-26 DIAGNOSIS — S025XXA Fracture of tooth (traumatic), initial encounter for closed fracture: Secondary | ICD-10-CM | POA: Diagnosis not present

## 2017-02-26 DIAGNOSIS — K2950 Unspecified chronic gastritis without bleeding: Secondary | ICD-10-CM | POA: Diagnosis not present

## 2017-02-26 DIAGNOSIS — K317 Polyp of stomach and duodenum: Secondary | ICD-10-CM | POA: Diagnosis not present

## 2017-02-26 DIAGNOSIS — E785 Hyperlipidemia, unspecified: Secondary | ICD-10-CM | POA: Diagnosis not present

## 2017-02-26 DIAGNOSIS — K219 Gastro-esophageal reflux disease without esophagitis: Secondary | ICD-10-CM | POA: Diagnosis not present

## 2017-02-26 DIAGNOSIS — G4733 Obstructive sleep apnea (adult) (pediatric): Secondary | ICD-10-CM | POA: Diagnosis not present

## 2017-02-26 DIAGNOSIS — K225 Diverticulum of esophagus, acquired: Secondary | ICD-10-CM | POA: Diagnosis not present

## 2017-02-26 DIAGNOSIS — Z7982 Long term (current) use of aspirin: Secondary | ICD-10-CM | POA: Diagnosis not present

## 2017-02-26 DIAGNOSIS — G4730 Sleep apnea, unspecified: Secondary | ICD-10-CM | POA: Diagnosis not present

## 2017-02-26 DIAGNOSIS — K319 Disease of stomach and duodenum, unspecified: Secondary | ICD-10-CM | POA: Diagnosis not present

## 2017-02-26 HISTORY — PX: UPPER GASTROINTESTINAL ENDOSCOPY: SHX188

## 2017-06-06 DIAGNOSIS — Z23 Encounter for immunization: Secondary | ICD-10-CM | POA: Diagnosis not present

## 2017-10-16 DIAGNOSIS — Z79899 Other long term (current) drug therapy: Secondary | ICD-10-CM | POA: Diagnosis not present

## 2017-10-16 DIAGNOSIS — I10 Essential (primary) hypertension: Secondary | ICD-10-CM | POA: Diagnosis not present

## 2017-10-16 DIAGNOSIS — Z0000 Encounter for general adult medical examination without abnormal findings: Secondary | ICD-10-CM | POA: Diagnosis not present

## 2017-10-16 DIAGNOSIS — K219 Gastro-esophageal reflux disease without esophagitis: Secondary | ICD-10-CM | POA: Diagnosis not present

## 2017-10-16 DIAGNOSIS — F329 Major depressive disorder, single episode, unspecified: Secondary | ICD-10-CM | POA: Diagnosis not present

## 2017-10-16 DIAGNOSIS — E785 Hyperlipidemia, unspecified: Secondary | ICD-10-CM | POA: Diagnosis not present

## 2017-11-01 DIAGNOSIS — R945 Abnormal results of liver function studies: Secondary | ICD-10-CM | POA: Diagnosis not present

## 2017-11-01 DIAGNOSIS — R10811 Right upper quadrant abdominal tenderness: Secondary | ICD-10-CM | POA: Diagnosis not present

## 2017-11-01 DIAGNOSIS — Z6832 Body mass index (BMI) 32.0-32.9, adult: Secondary | ICD-10-CM | POA: Diagnosis not present

## 2017-11-01 DIAGNOSIS — Z9049 Acquired absence of other specified parts of digestive tract: Secondary | ICD-10-CM | POA: Diagnosis not present

## 2017-11-05 DIAGNOSIS — D1801 Hemangioma of skin and subcutaneous tissue: Secondary | ICD-10-CM | POA: Diagnosis not present

## 2017-11-05 DIAGNOSIS — L820 Inflamed seborrheic keratosis: Secondary | ICD-10-CM | POA: Diagnosis not present

## 2017-11-05 DIAGNOSIS — B079 Viral wart, unspecified: Secondary | ICD-10-CM | POA: Diagnosis not present

## 2017-11-05 DIAGNOSIS — L821 Other seborrheic keratosis: Secondary | ICD-10-CM | POA: Diagnosis not present

## 2017-11-05 DIAGNOSIS — L578 Other skin changes due to chronic exposure to nonionizing radiation: Secondary | ICD-10-CM | POA: Diagnosis not present

## 2017-11-09 DIAGNOSIS — R10811 Right upper quadrant abdominal tenderness: Secondary | ICD-10-CM | POA: Diagnosis not present

## 2017-11-09 DIAGNOSIS — R1010 Upper abdominal pain, unspecified: Secondary | ICD-10-CM | POA: Diagnosis not present

## 2017-12-25 DIAGNOSIS — R29898 Other symptoms and signs involving the musculoskeletal system: Secondary | ICD-10-CM | POA: Diagnosis not present

## 2017-12-25 DIAGNOSIS — Z6832 Body mass index (BMI) 32.0-32.9, adult: Secondary | ICD-10-CM | POA: Diagnosis not present

## 2017-12-25 DIAGNOSIS — R2681 Unsteadiness on feet: Secondary | ICD-10-CM | POA: Diagnosis not present

## 2018-01-04 DIAGNOSIS — M6281 Muscle weakness (generalized): Secondary | ICD-10-CM | POA: Diagnosis not present

## 2018-01-04 DIAGNOSIS — R2681 Unsteadiness on feet: Secondary | ICD-10-CM | POA: Diagnosis not present

## 2018-01-04 DIAGNOSIS — Z9181 History of falling: Secondary | ICD-10-CM | POA: Diagnosis not present

## 2018-01-08 DIAGNOSIS — R2681 Unsteadiness on feet: Secondary | ICD-10-CM | POA: Diagnosis not present

## 2018-01-08 DIAGNOSIS — M6281 Muscle weakness (generalized): Secondary | ICD-10-CM | POA: Diagnosis not present

## 2018-01-08 DIAGNOSIS — Z9181 History of falling: Secondary | ICD-10-CM | POA: Diagnosis not present

## 2018-01-14 DIAGNOSIS — R2681 Unsteadiness on feet: Secondary | ICD-10-CM | POA: Diagnosis not present

## 2018-01-14 DIAGNOSIS — Z9181 History of falling: Secondary | ICD-10-CM | POA: Diagnosis not present

## 2018-01-14 DIAGNOSIS — M6281 Muscle weakness (generalized): Secondary | ICD-10-CM | POA: Diagnosis not present

## 2018-01-21 DIAGNOSIS — R2681 Unsteadiness on feet: Secondary | ICD-10-CM | POA: Diagnosis not present

## 2018-01-21 DIAGNOSIS — M6281 Muscle weakness (generalized): Secondary | ICD-10-CM | POA: Diagnosis not present

## 2018-01-21 DIAGNOSIS — Z9181 History of falling: Secondary | ICD-10-CM | POA: Diagnosis not present

## 2018-03-21 DIAGNOSIS — R0683 Snoring: Secondary | ICD-10-CM | POA: Diagnosis not present

## 2018-03-21 DIAGNOSIS — Z6832 Body mass index (BMI) 32.0-32.9, adult: Secondary | ICD-10-CM | POA: Diagnosis not present

## 2018-03-21 DIAGNOSIS — G4733 Obstructive sleep apnea (adult) (pediatric): Secondary | ICD-10-CM | POA: Diagnosis not present

## 2018-03-21 DIAGNOSIS — G4719 Other hypersomnia: Secondary | ICD-10-CM | POA: Diagnosis not present

## 2018-03-25 ENCOUNTER — Encounter: Payer: Self-pay | Admitting: Gastroenterology

## 2018-04-03 ENCOUNTER — Encounter: Payer: Self-pay | Admitting: Gastroenterology

## 2018-04-18 DIAGNOSIS — G4733 Obstructive sleep apnea (adult) (pediatric): Secondary | ICD-10-CM | POA: Diagnosis not present

## 2018-04-19 ENCOUNTER — Telehealth: Payer: Self-pay | Admitting: Gastroenterology

## 2018-04-19 MED ORDER — MESALAMINE ER 0.375 G PO CP24: ORAL_CAPSULE | ORAL | 3 refills | Status: DC

## 2018-04-19 NOTE — Telephone Encounter (Signed)
Resent prescription to pharmacy.

## 2018-04-19 NOTE — Addendum Note (Signed)
Addended by: Karena Addison on: 04/19/2018 03:07 PM   Modules accepted: Orders

## 2018-04-19 NOTE — Telephone Encounter (Signed)
Sent refills to patients pharmacy.  

## 2018-04-19 NOTE — Telephone Encounter (Signed)
Emily Hancock from [place] pharmacy([address]) states that she has not received this rx. Please resend.

## 2018-05-01 DIAGNOSIS — G4733 Obstructive sleep apnea (adult) (pediatric): Secondary | ICD-10-CM | POA: Diagnosis not present

## 2018-05-02 DIAGNOSIS — G4733 Obstructive sleep apnea (adult) (pediatric): Secondary | ICD-10-CM | POA: Diagnosis not present

## 2018-05-20 ENCOUNTER — Ambulatory Visit (AMBULATORY_SURGERY_CENTER): Payer: Self-pay

## 2018-05-20 VITALS — Ht 64.0 in | Wt 190.0 lb

## 2018-05-20 DIAGNOSIS — Z800 Family history of malignant neoplasm of digestive organs: Secondary | ICD-10-CM

## 2018-05-20 DIAGNOSIS — K51919 Ulcerative colitis, unspecified with unspecified complications: Secondary | ICD-10-CM

## 2018-05-20 DIAGNOSIS — Z86010 Personal history of colonic polyps: Secondary | ICD-10-CM

## 2018-05-20 NOTE — Progress Notes (Signed)
No egg or soy allergy known to patient  Issues with past sedation recently at oral surgeon. Difficult to wake, no intubation problems  No diet pills per patient No home 02 use per patient  No blood thinners per patient  Pt denies issues with constipation  No A fib or A flutter  EMMI video sent to pt's e mail. Pt declined

## 2018-06-03 ENCOUNTER — Encounter: Payer: Self-pay | Admitting: Gastroenterology

## 2018-06-03 ENCOUNTER — Ambulatory Visit (AMBULATORY_SURGERY_CENTER): Payer: Medicare Other | Admitting: Gastroenterology

## 2018-06-03 VITALS — BP 130/56 | HR 62 | Temp 98.9°F | Resp 23 | Ht 64.0 in | Wt 190.0 lb

## 2018-06-03 DIAGNOSIS — K529 Noninfective gastroenteritis and colitis, unspecified: Secondary | ICD-10-CM | POA: Diagnosis not present

## 2018-06-03 DIAGNOSIS — K51919 Ulcerative colitis, unspecified with unspecified complications: Secondary | ICD-10-CM

## 2018-06-03 DIAGNOSIS — I10 Essential (primary) hypertension: Secondary | ICD-10-CM | POA: Diagnosis not present

## 2018-06-03 DIAGNOSIS — K5150 Left sided colitis without complications: Secondary | ICD-10-CM | POA: Diagnosis not present

## 2018-06-03 DIAGNOSIS — G4733 Obstructive sleep apnea (adult) (pediatric): Secondary | ICD-10-CM | POA: Diagnosis not present

## 2018-06-03 DIAGNOSIS — Z800 Family history of malignant neoplasm of digestive organs: Secondary | ICD-10-CM

## 2018-06-03 DIAGNOSIS — Z86010 Personal history of colonic polyps: Secondary | ICD-10-CM | POA: Diagnosis not present

## 2018-06-03 MED ORDER — SODIUM CHLORIDE 0.9 % IV SOLN: 500.0000 mL | Freq: Once | INTRAVENOUS | Status: DC

## 2018-06-03 NOTE — Progress Notes (Signed)
Called to room to assist during endoscopic procedure.  Patient ID and intended procedure confirmed with present staff. Received instructions for my participation in the procedure from the performing physician.  

## 2018-06-03 NOTE — Op Note (Signed)
[place] Patient Name: Emily Hancock Procedure Date: 06/03/2018 11:28 AM MRN: [id number] Endoscopist: Jackquline Denmark , MD Age: 77 Referring MD:  Date of Birth: 1941/02/08 Gender: Female Account #: [id number] Procedure:                Colonoscopy Indications:              1. History of left-sided ulcerative colitis Dx                            2000. 2. Family history of colon cancer - mother                            died at the age of 46. Medicines:                Propofol per Anesthesia, Monitored Anesthesia Care Procedure:                Pre-Anesthesia Assessment:                           - Prior to the procedure, a History and Physical                            was performed, and patient medications and                            allergies were reviewed. The patient's tolerance of                            previous anesthesia was also reviewed. The risks                            and benefits of the procedure and the sedation                            options and risks were discussed with the patient.                            All questions were answered, and informed consent                            was obtained. Prior Anticoagulants: The patient has                            taken no previous anticoagulant or antiplatelet                            agents. ASA Grade Assessment: II - A patient with                            mild systemic disease. After reviewing the risks                            and benefits, the patient was deemed in  satisfactory condition to undergo the procedure.                           After obtaining informed consent, the colonoscope                            was passed under direct vision. Throughout the                            procedure, the patient's blood pressure, pulse, and                            oxygen saturations were monitored continuously. The                            Colonoscope was  introduced through the anus and                            advanced to the 2 cm into the ileum. The                            colonoscopy was performed without difficulty. The                            patient tolerated the procedure well. The quality                            of the bowel preparation was good. Scope In: 11:35:32 AM Scope Out: 11:49:43 AM Scope Withdrawal Time: 0 hours 11 minutes 7 seconds  Total Procedure Duration: 0 hours 14 minutes 11 seconds  Findings:                 Very minimally smudged mucosa in the left colon up                            to splenic flexure. No active colitis. Multiple                            biopsies were obtained from the left colon                            including the rectum to rule out dysplasia. The                            remaining colon was normal. Multiple biopsies were                            also obtained from the right side of the colon as                            well. The terminal ileum was normal. ( not biopsied  since previous biopsies were negative)                           A few small-mouthed diverticula were found in the                            sigmoid colon.                           Non-bleeding internal hemorrhoids were found during                            retroflexion. The hemorrhoids were small. Complications:            No immediate complications. Estimated Blood Loss:     Estimated blood loss: none. Impression:               - Minimally smudged left colonic mucosa ( biopsied.                            No active colitis)                           - Diverticulosis in the sigmoid colon.                           - Non-bleeding internal hemorrhoids. Recommendation:           - Patient has a contact number available for                            emergencies. The signs and symptoms of potential                            delayed complications were discussed with the                             patient. Return to normal activities tomorrow.                            Written discharge instructions were provided to the                            patient.                           - Resume previous diet.                           - Continue present medications.                           - Await pathology results.                           - Repeat colonoscopy for surveillance based on  pathology results.                           - Return to GI clinic in 6 months. Jackquline Denmark, MD 06/03/2018 12:00:47 PM This report has been signed electronically.

## 2018-06-03 NOTE — Patient Instructions (Signed)
Impression/Recommendations:  Diverticulosis handout given to patient. Hemorrhoid handout given to patient.  Resume previous diet. Continue present medications.  Repeat colonoscopy for surveillance based on pathology results.  Return to GI clinic in 6 months.  YOU HAD AN ENDOSCOPIC PROCEDURE TODAY AT [facility]:   Refer to the procedure report that was given to you for any specific questions about what was found during the examination.  If the procedure report does not answer your questions, please call your gastroenterologist to clarify.  If you requested that your care partner not be given the details of your procedure findings, then the procedure report has been included in a sealed envelope for you to review at your convenience later.  YOU SHOULD EXPECT: Some feelings of bloating in the abdomen. Passage of more gas than usual.  Walking can help get rid of the air that was put into your GI tract during the procedure and reduce the bloating. If you had a lower endoscopy (such as a colonoscopy or flexible sigmoidoscopy) you may notice spotting of blood in your stool or on the toilet paper. If you underwent a bowel prep for your procedure, you may not have a normal bowel movement for a few days.  Please Note:  You might notice some irritation and congestion in your nose or some drainage.  This is from the oxygen used during your procedure.  There is no need for concern and it should clear up in a day or so.  SYMPTOMS TO REPORT IMMEDIATELY:   Following lower endoscopy (colonoscopy or flexible sigmoidoscopy):  Excessive amounts of blood in the stool  Significant tenderness or worsening of abdominal pains  Swelling of the abdomen that is new, acute  Fever of 100F or higher For urgent or emergent issues, a gastroenterologist can be reached at any hour by calling [phone].   DIET:  We do recommend a small meal at first, but then you may proceed to your regular diet.   Drink plenty of fluids but you should avoid alcoholic beverages for 24 hours.  ACTIVITY:  You should plan to take it easy for the rest of today and you should NOT DRIVE or use heavy machinery until tomorrow (because of the sedation medicines used during the test).    FOLLOW UP: Our staff will call the number listed on your records the next business day following your procedure to check on you and address any questions or concerns that you may have regarding the information given to you following your procedure. If we do not reach you, we will leave a message.  However, if you are feeling well and you are not experiencing any problems, there is no need to return our call.  We will assume that you have returned to your regular daily activities without incident.  If any biopsies were taken you will be contacted by phone or by letter within the next 1-3 weeks.  Please call us at [phone] if you have not heard about the biopsies in 3 weeks.    SIGNATURES/CONFIDENTIALITY: You and/or your care partner have signed paperwork which will be entered into your electronic medical record.  These signatures attest to the fact that that the information above on your After Visit Summary has been reviewed and is understood.  Full responsibility of the confidentiality of this discharge information lies with you and/or your care-partner.

## 2018-06-03 NOTE — Progress Notes (Signed)
Report given to PACU, vss 

## 2018-06-03 NOTE — Progress Notes (Signed)
Pt's states no medical or surgical changes since previsit or office visit. 

## 2018-06-04 ENCOUNTER — Telehealth: Payer: Self-pay | Admitting: *Deleted

## 2018-06-04 ENCOUNTER — Telehealth: Payer: Self-pay

## 2018-06-04 NOTE — Telephone Encounter (Signed)
  Follow up Call-  Call back number 06/03/2018  Post procedure Call Back phone  # [phone]  Permission to leave phone message Yes  Some recent data might be hidden     Patient questions:  Phone just kept ringing. Unable to leave message.

## 2018-06-04 NOTE — Telephone Encounter (Signed)
Unable to leave message no answer.

## 2018-06-05 DIAGNOSIS — Z23 Encounter for immunization: Secondary | ICD-10-CM | POA: Diagnosis not present

## 2018-06-06 ENCOUNTER — Encounter: Payer: Self-pay | Admitting: Gastroenterology

## 2018-08-27 ENCOUNTER — Other Ambulatory Visit: Payer: Self-pay | Admitting: Gastroenterology

## 2018-10-04 DIAGNOSIS — Z79899 Other long term (current) drug therapy: Secondary | ICD-10-CM | POA: Diagnosis not present

## 2018-10-04 DIAGNOSIS — E785 Hyperlipidemia, unspecified: Secondary | ICD-10-CM | POA: Diagnosis not present

## 2018-10-04 DIAGNOSIS — F331 Major depressive disorder, recurrent, moderate: Secondary | ICD-10-CM | POA: Diagnosis not present

## 2018-10-04 DIAGNOSIS — R739 Hyperglycemia, unspecified: Secondary | ICD-10-CM | POA: Diagnosis not present

## 2018-10-04 DIAGNOSIS — I10 Essential (primary) hypertension: Secondary | ICD-10-CM | POA: Diagnosis not present

## 2018-10-04 DIAGNOSIS — K219 Gastro-esophageal reflux disease without esophagitis: Secondary | ICD-10-CM | POA: Diagnosis not present

## 2018-10-23 DIAGNOSIS — J309 Allergic rhinitis, unspecified: Secondary | ICD-10-CM | POA: Diagnosis not present

## 2018-10-23 DIAGNOSIS — J329 Chronic sinusitis, unspecified: Secondary | ICD-10-CM | POA: Diagnosis not present

## 2018-10-23 DIAGNOSIS — J3089 Other allergic rhinitis: Secondary | ICD-10-CM | POA: Diagnosis not present

## 2018-10-23 DIAGNOSIS — J40 Bronchitis, not specified as acute or chronic: Secondary | ICD-10-CM | POA: Diagnosis not present

## 2018-12-03 ENCOUNTER — Other Ambulatory Visit: Payer: Self-pay | Admitting: Gastroenterology

## 2018-12-13 DIAGNOSIS — Z6836 Body mass index (BMI) 36.0-36.9, adult: Secondary | ICD-10-CM | POA: Diagnosis not present

## 2018-12-13 DIAGNOSIS — J329 Chronic sinusitis, unspecified: Secondary | ICD-10-CM | POA: Diagnosis not present

## 2019-04-09 ENCOUNTER — Other Ambulatory Visit: Payer: Self-pay | Admitting: Gastroenterology

## 2019-04-14 ENCOUNTER — Other Ambulatory Visit: Payer: Self-pay

## 2019-05-12 DIAGNOSIS — I10 Essential (primary) hypertension: Secondary | ICD-10-CM | POA: Diagnosis not present

## 2019-05-12 DIAGNOSIS — E785 Hyperlipidemia, unspecified: Secondary | ICD-10-CM | POA: Diagnosis not present

## 2019-06-09 DIAGNOSIS — R296 Repeated falls: Secondary | ICD-10-CM | POA: Diagnosis not present

## 2019-06-09 DIAGNOSIS — I10 Essential (primary) hypertension: Secondary | ICD-10-CM | POA: Diagnosis not present

## 2019-06-09 DIAGNOSIS — Z23 Encounter for immunization: Secondary | ICD-10-CM | POA: Diagnosis not present

## 2019-06-09 DIAGNOSIS — R29898 Other symptoms and signs involving the musculoskeletal system: Secondary | ICD-10-CM | POA: Diagnosis not present

## 2019-06-09 DIAGNOSIS — R739 Hyperglycemia, unspecified: Secondary | ICD-10-CM | POA: Diagnosis not present

## 2019-06-09 DIAGNOSIS — R2681 Unsteadiness on feet: Secondary | ICD-10-CM | POA: Diagnosis not present

## 2019-06-09 DIAGNOSIS — Z79899 Other long term (current) drug therapy: Secondary | ICD-10-CM | POA: Diagnosis not present

## 2019-06-09 DIAGNOSIS — R404 Transient alteration of awareness: Secondary | ICD-10-CM | POA: Diagnosis not present

## 2019-06-09 DIAGNOSIS — E785 Hyperlipidemia, unspecified: Secondary | ICD-10-CM | POA: Diagnosis not present

## 2019-06-18 DIAGNOSIS — M6281 Muscle weakness (generalized): Secondary | ICD-10-CM | POA: Diagnosis not present

## 2019-06-18 DIAGNOSIS — R296 Repeated falls: Secondary | ICD-10-CM | POA: Diagnosis not present

## 2019-06-25 DIAGNOSIS — M6281 Muscle weakness (generalized): Secondary | ICD-10-CM | POA: Diagnosis not present

## 2019-06-25 DIAGNOSIS — R296 Repeated falls: Secondary | ICD-10-CM | POA: Diagnosis not present

## 2019-06-27 DIAGNOSIS — R296 Repeated falls: Secondary | ICD-10-CM | POA: Diagnosis not present

## 2019-06-27 DIAGNOSIS — M6281 Muscle weakness (generalized): Secondary | ICD-10-CM | POA: Diagnosis not present

## 2019-07-01 DIAGNOSIS — R296 Repeated falls: Secondary | ICD-10-CM | POA: Diagnosis not present

## 2019-07-01 DIAGNOSIS — M6281 Muscle weakness (generalized): Secondary | ICD-10-CM | POA: Diagnosis not present

## 2019-07-03 DIAGNOSIS — R296 Repeated falls: Secondary | ICD-10-CM | POA: Diagnosis not present

## 2019-07-03 DIAGNOSIS — M6281 Muscle weakness (generalized): Secondary | ICD-10-CM | POA: Diagnosis not present

## 2019-07-07 DIAGNOSIS — R296 Repeated falls: Secondary | ICD-10-CM | POA: Diagnosis not present

## 2019-07-07 DIAGNOSIS — M6281 Muscle weakness (generalized): Secondary | ICD-10-CM | POA: Diagnosis not present

## 2019-07-09 DIAGNOSIS — M6281 Muscle weakness (generalized): Secondary | ICD-10-CM | POA: Diagnosis not present

## 2019-07-09 DIAGNOSIS — R296 Repeated falls: Secondary | ICD-10-CM | POA: Diagnosis not present

## 2019-07-17 DIAGNOSIS — M6281 Muscle weakness (generalized): Secondary | ICD-10-CM | POA: Diagnosis not present

## 2019-07-17 DIAGNOSIS — R296 Repeated falls: Secondary | ICD-10-CM | POA: Diagnosis not present

## 2019-07-22 DIAGNOSIS — R296 Repeated falls: Secondary | ICD-10-CM | POA: Diagnosis not present

## 2019-07-22 DIAGNOSIS — M6281 Muscle weakness (generalized): Secondary | ICD-10-CM | POA: Diagnosis not present

## 2019-07-25 DIAGNOSIS — R296 Repeated falls: Secondary | ICD-10-CM | POA: Diagnosis not present

## 2019-07-25 DIAGNOSIS — M6281 Muscle weakness (generalized): Secondary | ICD-10-CM | POA: Diagnosis not present

## 2019-07-29 DIAGNOSIS — M6281 Muscle weakness (generalized): Secondary | ICD-10-CM | POA: Diagnosis not present

## 2019-07-29 DIAGNOSIS — R296 Repeated falls: Secondary | ICD-10-CM | POA: Diagnosis not present

## 2019-08-12 ENCOUNTER — Other Ambulatory Visit: Payer: Self-pay | Admitting: Gastroenterology

## 2019-08-22 DIAGNOSIS — Z0000 Encounter for general adult medical examination without abnormal findings: Secondary | ICD-10-CM | POA: Diagnosis not present

## 2019-08-22 DIAGNOSIS — K5190 Ulcerative colitis, unspecified, without complications: Secondary | ICD-10-CM | POA: Diagnosis not present

## 2019-08-22 DIAGNOSIS — R1310 Dysphagia, unspecified: Secondary | ICD-10-CM | POA: Diagnosis not present

## 2019-08-22 DIAGNOSIS — K219 Gastro-esophageal reflux disease without esophagitis: Secondary | ICD-10-CM | POA: Diagnosis not present

## 2019-08-22 DIAGNOSIS — Z8719 Personal history of other diseases of the digestive system: Secondary | ICD-10-CM | POA: Diagnosis not present

## 2019-09-11 ENCOUNTER — Ambulatory Visit: Payer: Medicare Other | Admitting: Gastroenterology

## 2019-09-30 DIAGNOSIS — N951 Menopausal and female climacteric states: Secondary | ICD-10-CM | POA: Diagnosis not present

## 2019-09-30 DIAGNOSIS — N760 Acute vaginitis: Secondary | ICD-10-CM | POA: Diagnosis not present

## 2019-09-30 DIAGNOSIS — N3090 Cystitis, unspecified without hematuria: Secondary | ICD-10-CM | POA: Diagnosis not present

## 2019-09-30 DIAGNOSIS — E785 Hyperlipidemia, unspecified: Secondary | ICD-10-CM | POA: Diagnosis not present

## 2019-11-06 DIAGNOSIS — E785 Hyperlipidemia, unspecified: Secondary | ICD-10-CM | POA: Diagnosis not present

## 2019-11-06 DIAGNOSIS — K219 Gastro-esophageal reflux disease without esophagitis: Secondary | ICD-10-CM | POA: Diagnosis not present

## 2019-11-06 DIAGNOSIS — I10 Essential (primary) hypertension: Secondary | ICD-10-CM | POA: Diagnosis not present

## 2019-11-06 DIAGNOSIS — M722 Plantar fascial fibromatosis: Secondary | ICD-10-CM | POA: Diagnosis not present

## 2019-12-01 ENCOUNTER — Other Ambulatory Visit: Payer: Self-pay

## 2019-12-01 ENCOUNTER — Encounter: Payer: Self-pay | Admitting: Gastroenterology

## 2019-12-01 ENCOUNTER — Ambulatory Visit (INDEPENDENT_AMBULATORY_CARE_PROVIDER_SITE_OTHER): Payer: Medicare Other | Admitting: Gastroenterology

## 2019-12-01 VITALS — BP 128/70 | HR 76 | Temp 98.2°F | Ht 63.0 in | Wt 225.0 lb

## 2019-12-01 DIAGNOSIS — K219 Gastro-esophageal reflux disease without esophagitis: Secondary | ICD-10-CM

## 2019-12-01 DIAGNOSIS — R1310 Dysphagia, unspecified: Secondary | ICD-10-CM

## 2019-12-01 MED ORDER — PANTOPRAZOLE SODIUM 40 MG PO TBEC: 40.0000 mg | DELAYED_RELEASE_TABLET | Freq: Two times a day (BID) | ORAL | 0 refills | Status: DC

## 2019-12-01 NOTE — Progress Notes (Signed)
Chief Complaint: dysphagia  Referring Provider:  Street, Sharon Mt, *     ASSESSMENT AND PLAN;  #1. GERD with eso dysphagia. H/O eso stricture. D/d includes Schatzki's ring, motility disorder, EoE, pill induced esophagitis, r/o esophageal carcinoma/extrinsic lesions or Achalasia.  #2. Left sided UC. Dx 2000. Colon 05/2018 showed mucosal healing.  #3. FH colon cancer.  Plan: -Protonix 40 mg p.o. BID x 1 month. -Ba swallow with Ba tablet as well. -EGD with possibly dil.  Discussed risks and benefits including small but definite risks of eso perforation, bleeding, risks of anesthesia.  The benefits were also discussed. Consent forms given. -I have instructed patient to chew foods especially meats and breads well and eat slowly.  HPI:    Emily Hancock is a 79 y.o. female  With intermittent dysphagia-mostly to solids, lower chest, at times has to " wash it with liquids".  More prominently over the last 6 to 8 months, getting worse.  Denies having any associated hoarseness, wheezing, shortness of breath, hematemesis, fever, chills, fatigue or weakness.  No melena or hematochezia.  Denies having any diarrhea or constipation.  Past GI procedures/history: - Dx with left-sided ulcerative colitis by Dr. Veneta Penton 05/1999.  Has been treated with Apriso.  Most recent colonoscopy (see under procedures tab) 05/2018 with complete mucosal healing. 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 Bx-fundic gland polyps   Past Medical History:  Diagnosis Date  . Allergy   . Asthma   . C. difficile colitis   . Cataract    bilateral  . Chronic headache   . COPD (chronic obstructive pulmonary disease) ([place])   . Depression   . Family history of colon cancer    Mother-died at 64. first cousin with colon cancer   . GERD (gastroesophageal reflux  disease)   . Hyperlipidemia    past hx  . Hypertension   . IBS (irritable bowel syndrome)   . 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 Sleep apnea    sleeps with cpap  . 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 CATARACT EXTRACTION  5 years  . CHOLECYSTECTOMY  2008  . COLONOSCOPY    . POLYPECTOMY    . REPLACEMENT TOTAL KNEE Left   . TUBAL LIGATION  decades  . UMBILICAL HERNIA REPAIR  2004  . UPPER GASTROINTESTINAL ENDOSCOPY  02/26/2017   Distal esophageal stricture status post esophageal dilatation. Distal esophageal diverticula. Presbyesophagus. Hiatal hernia. Gastric polyps (status post polypectomy x2)    Family History  Problem Relation Age of Onset  . Ulcerative colitis Daughter   . Colon cancer Neg Hx   . Colon polyps Neg Hx   . Rectal cancer Neg Hx   . Stomach cancer Neg Hx   . Esophageal cancer Neg Hx     Social History   Tobacco Use  . Smoking status: Former Smoker    Types: Cigarettes    Quit date: 1994    Years since quitting: 27.2  . Smokeless tobacco: Never Used  Substance Use Topics  . Alcohol use: Yes    Comment: occasionally  . Drug use: Never    Current Outpatient Medications  Medication Sig Dispense Refill  . aspirin EC 81 MG tablet Take by mouth.    . cetirizine (ZYRTEC) 10 MG tablet TAKE 1 TABLET BY MOUTH EVERY DAY    .  cloNIDine (CATAPRES) 0.3 MG tablet     . ibuprofen (ADVIL,MOTRIN) 400 MG tablet TAKE 1 TABLET BY MOUTH EVERY 4 HOURS  0  . levocetirizine (XYZAL) 5 MG tablet Take 5 mg by mouth daily.  3  . mesalamine (APRISO) 0.375 g 24 hr capsule TAKE FOUR CAPSULES BY MOUTH EVERY DAY 120 capsule 3  . rosuvastatin (CRESTOR) 5 MG tablet Take 5 mg by mouth daily.    Marland Kitchen venlafaxine XR (EFFEXOR-XR) 150 MG 24 hr capsule Take 150 mg by mouth daily.     . bisacodyl (BISACODYL) 5 MG EC tablet Take 5 mg by mouth daily as needed for moderate constipation.    . pantoprazole (PROTONIX) 40 MG  tablet Take 1 tablet (40 mg total) by mouth 2 (two) times daily. 60 tablet 0   No current facility-administered medications for this visit.    Not on File  Review of Systems:  Constitutional: Denies fever, chills, diaphoresis, appetite change and fatigue.  HEENT: Denies photophobia, eye pain, redness, hearing loss, ear pain, congestion, sore throat, rhinorrhea, sneezing, mouth sores, neck pain, neck stiffness and tinnitus.   Respiratory: Denies SOB, DOE, cough, chest tightness,  and wheezing.   Cardiovascular: Denies chest pain, palpitations and leg swelling.  Genitourinary: Denies dysuria, urgency, frequency, hematuria, flank pain and difficulty urinating.  Musculoskeletal: Denies myalgias, back pain, joint swelling, arthralgias and gait problem.  Skin: No rash.  Neurological: Denies dizziness, seizures, syncope, weakness, light-headedness, numbness and headaches.  Hematological: Denies adenopathy. Easy bruising, personal or family bleeding history  Psychiatric/Behavioral: Has anxiety or depression     Physical Exam:    BP 128/70   Pulse 76   Temp 98.2 F (36.8 C)   Ht 5\' 3"  (1.6 m)   Wt 225 lb (102.1 kg)   BMI 39.86 kg/m  Wt Readings from Last 3 Encounters:  12/01/19 225 lb (102.1 kg)  06/03/18 190 lb (86.2 kg)  05/20/18 190 lb (86.2 kg)   Constitutional:  Well-developed, in no acute distress. Psychiatric: Normal mood and affect. Behavior is normal. HEENT: Pupils normal.  Conjunctivae are normal. No scleral icterus. Neck supple.  Cardiovascular: Normal rate, regular rhythm. No edema Pulmonary/chest: Effort normal and breath sounds normal. No wheezing, rales or rhonchi. Abdominal: Soft, nondistended. Nontender. Bowel sounds active throughout. There are no masses palpable. No hepatomegaly. Rectal:  defered Neurological: Alert and oriented to person place and time. Skin: Skin is warm and dry. No rashes noted.    Carmell Austria, MD 12/01/2019, 5:31 PM  Cc: Street,  Sharon Mt, *

## 2019-12-01 NOTE — Patient Instructions (Signed)
If you are age 78 or older, your body mass index should be between 23-30. Your Body mass index is 39.86 kg/m. If this is out of the aforementioned range listed, please consider follow up with your Primary Care Provider.  If you are age 59 or younger, your body mass index should be between 19-25. Your Body mass index is 39.86 kg/m. If this is out of the aformentioned range listed, please consider follow up with your Primary Care Provider.   You have been scheduled for a Barium Esophogram at [facility] (1st floor of the hospital) on 12/09/19 at 10:30am. Please arrive 15 minutes prior to your appointment for registration. Make certain not to have anything to eat or drink 3 hours prior to your test. If you need to reschedule for any reason, please contact radiology at [phone] to do so. __________________________________________________________________ A barium swallow is an examination that concentrates on views of the esophagus. This tends to be a double contrast exam (barium and two liquids which, when combined, create a gas to distend the wall of the oesophagus) or single contrast (non-ionic iodine based). The study is usually tailored to your symptoms so a good history is essential. Attention is paid during the study to the form, structure and configuration of the esophagus, looking for functional disorders (such as aspiration, dysphagia, achalasia, motility and reflux) EXAMINATION You may be asked to change into a gown, depending on the type of swallow being performed. A radiologist and radiographer will perform the procedure. The radiologist will advise you of the type of contrast selected for your procedure and direct you during the exam. You will be asked to stand, sit or lie in several different positions and to hold a small amount of fluid in your mouth before being asked to swallow while the imaging is performed .In some instances you may be asked to swallow barium coated  marshmallows to assess the motility of a solid food bolus. The exam can be recorded as a digital or video fluoroscopy procedure. POST PROCEDURE It will take 1-2 days for the barium to pass through your system. To facilitate this, it is important, unless otherwise directed, to increase your fluids for the next 24-48hrs and to resume your normal diet.  This test typically takes about 30 minutes to perform. __________________________________________________________________________________  Emily Hancock have been scheduled for an endoscopy. Please follow written instructions given to you at your visit today. If you use inhalers (even only as needed), please bring them with you on the day of your procedure. Your physician has requested that you go to www.startemmi.com and enter the access code given to you at your visit today. This web site gives a general overview about your procedure. However, you should still follow specific instructions given to you by our office regarding your preparation for the procedure.  We have sent the following medications to your pharmacy for you to pick up at your convenience: Protonix 40 mg twice daily x 1 month.   Thank you,  Dr. Jackquline Denmark

## 2019-12-02 ENCOUNTER — Telehealth: Payer: Self-pay | Admitting: Gastroenterology

## 2019-12-02 ENCOUNTER — Other Ambulatory Visit: Payer: Self-pay

## 2019-12-02 MED ORDER — PANTOPRAZOLE SODIUM 40 MG PO TBEC: 40.0000 mg | DELAYED_RELEASE_TABLET | Freq: Two times a day (BID) | ORAL | 0 refills | Status: DC

## 2019-12-02 NOTE — Progress Notes (Signed)
Sent prescription to patient's preferred pharmacy.

## 2019-12-09 ENCOUNTER — Other Ambulatory Visit: Payer: Self-pay | Admitting: Gastroenterology

## 2019-12-09 ENCOUNTER — Ambulatory Visit (HOSPITAL_COMMUNITY)
Admission: RE | Admit: 2019-12-09 | Discharge: 2019-12-09 | Disposition: A | Discharge status: Home / Self Care | Payer: Medicare Other | Source: Ambulatory Visit | Attending: Gastroenterology | Admitting: Gastroenterology

## 2019-12-09 ENCOUNTER — Other Ambulatory Visit: Payer: Self-pay

## 2019-12-09 DIAGNOSIS — R1310 Dysphagia, unspecified: Secondary | ICD-10-CM | POA: Diagnosis not present

## 2019-12-09 DIAGNOSIS — K224 Dyskinesia of esophagus: Secondary | ICD-10-CM | POA: Diagnosis not present

## 2019-12-09 DIAGNOSIS — K219 Gastro-esophageal reflux disease without esophagitis: Secondary | ICD-10-CM | POA: Insufficient documentation

## 2019-12-23 ENCOUNTER — Other Ambulatory Visit: Payer: Self-pay

## 2019-12-23 ENCOUNTER — Ambulatory Visit (AMBULATORY_SURGERY_CENTER): Payer: Medicare Other | Admitting: Gastroenterology

## 2019-12-23 ENCOUNTER — Encounter: Payer: Self-pay | Admitting: Gastroenterology

## 2019-12-23 VITALS — BP 130/70 | HR 63 | Temp 97.3°F | Resp 19 | Ht 63.0 in | Wt 225.0 lb

## 2019-12-23 DIAGNOSIS — G4733 Obstructive sleep apnea (adult) (pediatric): Secondary | ICD-10-CM | POA: Diagnosis not present

## 2019-12-23 DIAGNOSIS — J449 Chronic obstructive pulmonary disease, unspecified: Secondary | ICD-10-CM | POA: Diagnosis not present

## 2019-12-23 DIAGNOSIS — K317 Polyp of stomach and duodenum: Secondary | ICD-10-CM

## 2019-12-23 DIAGNOSIS — K222 Esophageal obstruction: Secondary | ICD-10-CM | POA: Diagnosis not present

## 2019-12-23 DIAGNOSIS — K2950 Unspecified chronic gastritis without bleeding: Secondary | ICD-10-CM

## 2019-12-23 DIAGNOSIS — K219 Gastro-esophageal reflux disease without esophagitis: Secondary | ICD-10-CM | POA: Diagnosis not present

## 2019-12-23 DIAGNOSIS — K225 Diverticulum of esophagus, acquired: Secondary | ICD-10-CM

## 2019-12-23 DIAGNOSIS — I10 Essential (primary) hypertension: Secondary | ICD-10-CM | POA: Diagnosis not present

## 2019-12-23 DIAGNOSIS — R1310 Dysphagia, unspecified: Secondary | ICD-10-CM

## 2019-12-23 MED ORDER — SODIUM CHLORIDE 0.9 % IV SOLN: 500.0000 mL | INTRAVENOUS | Status: DC

## 2019-12-23 NOTE — Patient Instructions (Signed)
Please read handouts provided. Follow Dilation Diet. Continue Protonix 40 mg twice daily. Return to GI clinic in 8 weeks.       YOU HAD AN ENDOSCOPIC PROCEDURE TODAY AT [facility]:   Refer to the procedure report that was given to you for any specific questions about what was found during the examination.  If the procedure report does not answer your questions, please call your gastroenterologist to clarify.  If you requested that your care partner not be given the details of your procedure findings, then the procedure report has been included in a sealed envelope for you to review at your convenience later.  YOU SHOULD EXPECT: Some feelings of bloating in the abdomen. Passage of more gas than usual.  Walking can help get rid of the air that was put into your GI tract during the procedure and reduce the bloating. If you had a lower endoscopy (such as a colonoscopy or flexible sigmoidoscopy) you may notice spotting of blood in your stool or on the toilet paper. If you underwent a bowel prep for your procedure, you may not have a normal bowel movement for a few days.  Please Note:  You might notice some irritation and congestion in your nose or some drainage.  This is from the oxygen used during your procedure.  There is no need for concern and it should clear up in a day or so.  SYMPTOMS TO REPORT IMMEDIATELY:    Following upper endoscopy (EGD)  Vomiting of blood or coffee ground material  New chest pain or pain under the shoulder blades  Painful or persistently difficult swallowing  New shortness of breath  Fever of 100F or higher  Black, tarry-looking stools  For urgent or emergent issues, a gastroenterologist can be reached at any hour by calling [phone]. Do not use MyChart messaging for urgent concerns.    DIET:  Drink plenty of fluids but you should avoid alcoholic beverages for 24 hours.  ACTIVITY:  You should plan to take it easy for the rest of today  and you should NOT DRIVE or use heavy machinery until tomorrow (because of the sedation medicines used during the test).    FOLLOW UP: Our staff will call the number listed on your records 48-72 hours following your procedure to check on you and address any questions or concerns that you may have regarding the information given to you following your procedure. If we do not reach you, we will leave a message.  We will attempt to reach you two times.  During this call, we will ask if you have developed any symptoms of COVID 19. If you develop any symptoms (ie: fever, flu-like symptoms, shortness of breath, cough etc.) before then, please call [phone].  If you test positive for Covid 19 in the 2 weeks post procedure, please call and report this information to [place].    If any biopsies were taken you will be contacted by phone or by letter within the next 1-3 weeks.  Please call us at [phone] if you have not heard about the biopsies in 3 weeks.    SIGNATURES/CONFIDENTIALITY: You and/or your care partner have signed paperwork which will be entered into your electronic medical record.  These signatures attest to the fact that that the information above on your After Visit Summary has been reviewed and is understood.  Full responsibility of the confidentiality of this discharge information lies with you and/or your care-partner.

## 2019-12-23 NOTE — Progress Notes (Signed)
tempp JB V/s DT

## 2019-12-23 NOTE — Progress Notes (Signed)
Report to PACU, RN, vss, BBS= Clear.  

## 2019-12-23 NOTE — Progress Notes (Signed)
Pt's states no medical or surgical changes since previsit or office visit. 

## 2019-12-23 NOTE — Op Note (Signed)
[facility] Patient Name: Emily Hancock Procedure Date: 12/23/2019 9:44 AM MRN: [id number] Endoscopist: Jackquline Denmark , MD Age: 79 Referring MD:  Date of Birth: 05/09/1941 Gender: Female Account #: [id number] Procedure:                Upper GI endoscopy Indications:              Dysphagia with abnormal barium swallow. Medicines:                Monitored Anesthesia Care Procedure:                Pre-Anesthesia Assessment:                           - Prior to the procedure, a History and Physical                            was performed, and patient medications and                            allergies were reviewed. The patient's tolerance of                            previous anesthesia was also reviewed. The risks                            and benefits of the procedure and the sedation                            options and risks were discussed with the patient.                            All questions were answered, and informed consent                            was obtained. Prior Anticoagulants: The patient has                            taken no previous anticoagulant or antiplatelet                            agents. ASA Grade Assessment: II - A patient with                            mild systemic disease. After reviewing the risks                            and benefits, the patient was deemed in                            satisfactory condition to undergo the procedure.                           After obtaining informed consent, the endoscope was  passed under direct vision. Throughout the                            procedure, the patient's blood pressure, pulse, and                            oxygen saturations were monitored continuously. The                            Endoscope was introduced through the mouth, and                            advanced to the second part of duodenum. The upper                            GI endoscopy was  accomplished without difficulty.                            The patient tolerated the procedure well. Scope In: Scope Out: Findings:                 The esophagus was highly tortuous especially in the                            distal 1/4th. 4 diverticula, 6 mm- 1cm were                            visualized in the distal esophagus proximal to the                            stricture. The stricture was visualized at 32 cm                            from the incisors with luminal diameter of                            approximately 14 mm. The stenosis was traversed. A                            TTS dilator was passed through the scope. Dilation                            with a 16-17-18 mm balloon dilator was performed to                            max 18 mm x 1 min each.                           A 6 cm hiatal hernia was present.                           Localized mild inflammation was found in the  gastric antrum. Biopsies were taken with a cold                            forceps for histology.                           10-12 gastric polyps measuring 4 to 6 mm visualized                            in the body of the stomach. Not biopsied as                            previously deemed to be hyperplastic.                           The examined duodenum was normal. Complications:            No immediate complications. Estimated Blood Loss:     Estimated blood loss: none. 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 Dilated.                           - Large hiatal hernia.                           - Gastritis. Biopsied.                           - Normal examined duodenum. Recommendation:           - Patient has a contact number available for                            emergencies. The signs and symptoms of potential                            delayed complications were discussed with  the                            patient. Return to normal activities tomorrow.                            Written discharge instructions were provided to the                            patient.                           - Post dilatation diet.                           - Continue Protonix 40 mg p.o. twice daily.                           -  Return to GI clinic in 8 weeks.                           - D/W Abigail Butts.                           - If still with problems, will perform further                            work-up. Jackquline Denmark, MD 12/23/2019 10:24:07 AM This report has been signed electronically.

## 2019-12-24 ENCOUNTER — Telehealth: Payer: Self-pay

## 2019-12-24 NOTE — Telephone Encounter (Signed)
Left message for patient to call back to the office;   Needs to be scheduled (from the recall) for:  8 wk f/u post EGD-dysphagia w/abnormal barium swallow/Gupta

## 2019-12-25 ENCOUNTER — Telehealth: Payer: Self-pay | Admitting: *Deleted

## 2019-12-25 ENCOUNTER — Telehealth: Payer: Self-pay

## 2019-12-25 NOTE — Telephone Encounter (Signed)
Second follow up call attempt, unable to LM

## 2019-12-25 NOTE — Telephone Encounter (Signed)
  Follow up Call-  Call back number 12/23/2019 06/03/2018  Post procedure Call Back phone  # [phone]  Permission to leave phone message Yes Yes  Some recent data might be hidden     Patient questions:  Phone kept ringing.

## 2019-12-25 NOTE — Telephone Encounter (Signed)
Called and spoke with patient-patient advised of MD recommendations and has been scheduled for a f/u appt on 02/11/2020 at 11:20 am; Patient advised to call back to the office at [phone] should questions/concerns arise;  Patient verbalized understanding of information/instructions;

## 2019-12-28 ENCOUNTER — Encounter: Payer: Self-pay | Admitting: Gastroenterology

## 2019-12-29 DIAGNOSIS — M722 Plantar fascial fibromatosis: Secondary | ICD-10-CM | POA: Diagnosis not present

## 2019-12-29 DIAGNOSIS — Z6839 Body mass index (BMI) 39.0-39.9, adult: Secondary | ICD-10-CM | POA: Diagnosis not present

## 2019-12-29 DIAGNOSIS — N952 Postmenopausal atrophic vaginitis: Secondary | ICD-10-CM | POA: Diagnosis not present

## 2020-01-05 ENCOUNTER — Other Ambulatory Visit: Payer: Self-pay | Admitting: Gastroenterology

## 2020-01-30 DIAGNOSIS — I10 Essential (primary) hypertension: Secondary | ICD-10-CM | POA: Diagnosis not present

## 2020-01-30 DIAGNOSIS — N898 Other specified noninflammatory disorders of vagina: Secondary | ICD-10-CM | POA: Diagnosis not present

## 2020-02-06 ENCOUNTER — Other Ambulatory Visit: Payer: Self-pay | Admitting: Gastroenterology

## 2020-02-11 ENCOUNTER — Encounter: Payer: Self-pay | Admitting: Gastroenterology

## 2020-02-11 ENCOUNTER — Ambulatory Visit (INDEPENDENT_AMBULATORY_CARE_PROVIDER_SITE_OTHER): Payer: Medicare Other | Admitting: Gastroenterology

## 2020-02-11 VITALS — BP 118/70 | HR 66 | Temp 98.2°F | Ht 63.0 in | Wt 220.4 lb

## 2020-02-11 DIAGNOSIS — R1310 Dysphagia, unspecified: Secondary | ICD-10-CM

## 2020-02-11 MED ORDER — PANTOPRAZOLE SODIUM 40 MG PO TBEC: 40.0000 mg | DELAYED_RELEASE_TABLET | Freq: Two times a day (BID) | ORAL | 11 refills | Status: DC

## 2020-02-11 NOTE — Progress Notes (Signed)
Chief Complaint: dysphagia  Referring Provider:  Street, Sharon Mt, *     ASSESSMENT AND PLAN;  #1. 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  Barium swallow and EGD also showed multiple esophageal diverticula, large hiatal hernia.   #2. Left sided UC. Dx 2000. Colon 05/2018 showed mucosal healing.  #3. FH colon cancer.  Plan: -Protonix 40 mg p.o. BID to continue -I have instructed patient to chew foods especially meats and breads well and eat slowly. -Repeat esophageal dilatation on as-needed basis. -Discussed extensively with the patient and patient's family.  Have also shown them barium films and EGD pictures.  HPI:    Emily Hancock is a 79 y.o. female   For follow-up visit. Feels significantly better No further dysphagia Tolerating p.o. well  Denies having any diarrhea or constipation.  Past GI procedures/history: - Dx with left-sided ulcerative colitis by Dr. Veneta Penton 05/1999.  Has been treated with Apriso.  Most recent colonoscopy (see under procedures tab) 05/2018 with complete mucosal healing. Bx-Quiescent colitis -EGD 12/2019 : 6 cm hiatal hernia, distal esophageal stricture with multiple esophageal diverticula s/p dilatation to TTS 18 mm balloon, fundic gland polyps.   Past Medical History:  Diagnosis Date  . Allergy   . Asthma   . C. difficile colitis   . Cataract    bilateral  . Chronic headache   . COPD (chronic obstructive pulmonary disease) ([facility])   . Depression   . Family history of colon cancer    Mother-died at 12. first cousin with colon cancer   . GERD (gastroesophageal reflux disease)   . Hyperlipidemia    past hx  . Hypertension   . IBS (irritable bowel syndrome)   . 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 Sleep apnea    sleeps with cpap  . 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 CATARACT EXTRACTION  5 years   . CHOLECYSTECTOMY  2008  . COLONOSCOPY    . POLYPECTOMY    . REPLACEMENT TOTAL KNEE Left   . TUBAL LIGATION  decades  . UMBILICAL HERNIA REPAIR  2004  . UPPER GASTROINTESTINAL ENDOSCOPY  02/26/2017   Distal esophageal stricture status post esophageal dilatation. Distal esophageal diverticula. Presbyesophagus. Hiatal hernia. Gastric polyps (status post polypectomy x2)    Family History  Problem Relation Age of Onset  . Ulcerative colitis Daughter   . Colon cancer Neg Hx   . Colon polyps Neg Hx   . Rectal cancer Neg Hx   . Stomach cancer Neg Hx   . Esophageal cancer Neg Hx     Social History   Tobacco Use  . Smoking status: Former Smoker    Types: Cigarettes    Quit date: 1994    Years since quitting: 27.4  . Smokeless tobacco: Never Used  Substance Use Topics  . Alcohol use: Yes    Comment: occasionally  . Drug use: Never    Current Outpatient Medications  Medication Sig Dispense Refill  . aspirin EC 81 MG tablet Take by mouth.    . cloNIDine (CATAPRES) 0.1 MG tablet Take 0.2 mg by mouth as needed.    . cloNIDine (CATAPRES) 0.3 MG tablet     . estradiol (ESTRACE) 0.1 MG/GM vaginal cream 1 Applicatorful 2 (two) times daily.    Marland Kitchen levocetirizine (XYZAL) 5 MG tablet Take 5 mg by mouth daily.  3  .  mesalamine (APRISO) 0.375 g 24 hr capsule TAKE FOUR CAPSULES BY MOUTH EVERY DAY 120 capsule 3  . nystatin cream (MYCOSTATIN) APPLY TWICE DAILY TO AFFECTED AREAS    . pantoprazole (PROTONIX) 40 MG tablet TAKE 1 TABLET BY MOUTH TWICE A DAY 60 tablet 11  . rosuvastatin (CRESTOR) 10 MG tablet Take 10 mg by mouth at bedtime.    Marland Kitchen venlafaxine (EFFEXOR) 75 MG tablet Take 225 mg by mouth daily.    Marland Kitchen amoxicillin (AMOXIL) 500 MG capsule Take 500 mg by mouth 4 (four) times daily as needed (when going to the dentist).     No current facility-administered medications for this visit.    No Known Allergies  Review of Systems:  neg     Physical Exam:    BP 118/70   Pulse 66   Temp  98.2 F (36.8 C)   Ht 5\' 3"  (1.6 m)   Wt 220 lb 6 oz (100 kg)   BMI 39.04 kg/m  Wt Readings from Last 3 Encounters:  02/11/20 220 lb 6 oz (100 kg)  12/23/19 225 lb (102.1 kg)  12/01/19 225 lb (102.1 kg)   Constitutional:  Well-developed, in no acute distress. Psychiatric: Normal mood and affect. Behavior is normal. Abdominal: Soft, nondistended. Nontender. Bowel sounds active throughout. There are no masses palpable. No hepatomegaly. Neurological: Alert and oriented to person place and time. Skin: Skin is warm and dry. No rashes noted.    Carmell Austria, MD 02/11/2020, 11:19 AM  Cc: Street, Sharon Mt, *

## 2020-02-11 NOTE — Patient Instructions (Signed)
If you are age 78 or older, your body mass index should be between 23-30. Your Body mass index is 39.04 kg/m. If this is out of the aforementioned range listed, please consider follow up with your Primary Care Provider.  If you are age 53 or younger, your body mass index should be between 19-25. Your Body mass index is 39.04 kg/m. If this is out of the aformentioned range listed, please consider follow up with your Primary Care Provider.   We have sent the following medications to your pharmacy for you to pick up at your convenience: Protonix 40mg  twice daily.  Chew foods espescially meats and breads well and eat slowly.  Follow up as needed.  Thank you for choosing me and [facility].  Jackquline Denmark, MD

## 2020-02-25 NOTE — Telephone Encounter (Signed)
DONE

## 2020-04-12 DIAGNOSIS — F331 Major depressive disorder, recurrent, moderate: Secondary | ICD-10-CM | POA: Diagnosis not present

## 2020-04-12 DIAGNOSIS — R0989 Other specified symptoms and signs involving the circulatory and respiratory systems: Secondary | ICD-10-CM | POA: Diagnosis not present

## 2020-04-12 DIAGNOSIS — D485 Neoplasm of uncertain behavior of skin: Secondary | ICD-10-CM | POA: Diagnosis not present

## 2020-04-12 DIAGNOSIS — R4189 Other symptoms and signs involving cognitive functions and awareness: Secondary | ICD-10-CM | POA: Diagnosis not present

## 2020-04-14 ENCOUNTER — Other Ambulatory Visit: Payer: Self-pay | Admitting: Gastroenterology

## 2020-05-04 DIAGNOSIS — L821 Other seborrheic keratosis: Secondary | ICD-10-CM | POA: Diagnosis not present

## 2020-08-03 DIAGNOSIS — Z23 Encounter for immunization: Secondary | ICD-10-CM | POA: Diagnosis not present

## 2020-08-23 DIAGNOSIS — I10 Essential (primary) hypertension: Secondary | ICD-10-CM | POA: Diagnosis not present

## 2020-08-23 DIAGNOSIS — K2960 Other gastritis without bleeding: Secondary | ICD-10-CM | POA: Diagnosis not present

## 2020-08-23 DIAGNOSIS — E785 Hyperlipidemia, unspecified: Secondary | ICD-10-CM | POA: Diagnosis not present

## 2020-08-23 DIAGNOSIS — K5190 Ulcerative colitis, unspecified, without complications: Secondary | ICD-10-CM | POA: Diagnosis not present

## 2020-08-23 DIAGNOSIS — Z79899 Other long term (current) drug therapy: Secondary | ICD-10-CM | POA: Diagnosis not present

## 2020-08-23 DIAGNOSIS — R7302 Impaired glucose tolerance (oral): Secondary | ICD-10-CM | POA: Diagnosis not present

## 2020-08-23 DIAGNOSIS — Z0000 Encounter for general adult medical examination without abnormal findings: Secondary | ICD-10-CM | POA: Diagnosis not present

## 2020-09-09 DIAGNOSIS — Z961 Presence of intraocular lens: Secondary | ICD-10-CM | POA: Diagnosis not present

## 2020-09-15 ENCOUNTER — Ambulatory Visit: Payer: Medicare Other | Admitting: Gastroenterology

## 2020-09-15 ENCOUNTER — Encounter: Payer: Self-pay | Admitting: Gastroenterology

## 2020-09-15 VITALS — BP 138/76 | HR 81 | Ht 63.0 in | Wt 217.0 lb

## 2020-09-15 DIAGNOSIS — R1310 Dysphagia, unspecified: Secondary | ICD-10-CM

## 2020-09-15 DIAGNOSIS — K643 Fourth degree hemorrhoids: Secondary | ICD-10-CM

## 2020-09-15 MED ORDER — FLUTICASONE PROPIONATE 0.05 % EX CREA: TOPICAL_CREAM | Freq: Two times a day (BID) | CUTANEOUS | 2 refills | Status: AC

## 2020-09-15 NOTE — Progress Notes (Signed)
Chief Complaint: FU  Referring Provider:  Street, Stephanie Coup, *     ASSESSMENT AND PLAN;   #1. Hoids (Gd IV)  Plan: -Benefiber 1 TBS p.o. QD with 8 oz of water. -fluticasone cream 0.05% generic 30g 1 bid PR x 10 days, 2 refills -Sitz baths BID x 7 days. -Avoid NSAIDs. -Call if still with problems in 2 weeks. -If still with problems, consider surgical referral to Dr. Logan Bores for EUA/hemorrhoidectomy.  #2. 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  Barium swallow and EGD also showed multiple esophageal diverticula, large hiatal hernia.  -Continue Protonix 40 mg p.o. once a day. -Repeat EGD with dilatation on as-needed basis.  #3.  Quiescent left sided UC. Dx 2000. Colon 05/2018 showed mucosal healing.  HPI:    Emily Hancock is a 80 y.o. female   For follow-up visit.  No further dysphagia.  In problems are those of "hemorrhoids".  These would occasionally become painful and would protrude out.  Sometimes these become painful.  She has been using over-the-counter Preparation H without significant relief.  Does have to strain occasionally to have a bowel movement.  Straining does make these worse.  Occasionally they would bleed as well.  No fever chills or night sweats.  No weight loss.  Past GI procedures/history: - Dx with left-sided ulcerative colitis by Dr. Sherian Maroon 05/1999.  Has been treated with Apriso.  Most recent colonoscopy (see under procedures tab) 05/2018 with complete mucosal healing. Bx-Quiescent colitis -EGD 12/2019 : 6 cm hiatal hernia, distal esophageal stricture with multiple esophageal diverticula s/p dilatation to TTS 18 mm balloon, fundic gland polyps.   Past Medical History:  Diagnosis Date  . Allergy   . Asthma   . C. difficile colitis   . Cataract    bilateral  . Chronic headache   . COPD (chronic obstructive pulmonary disease) ([facility])   . Depression   . Family history of colon cancer     Mother-died at 13. first cousin with colon cancer   . GERD (gastroesophageal reflux disease)   . Hyperlipidemia    past hx  . Hypertension   . IBS (irritable bowel syndrome)   . 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 Sleep apnea    sleeps with cpap  . 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 CATARACT EXTRACTION  5 years  . CHOLECYSTECTOMY  2008  . COLONOSCOPY    . POLYPECTOMY    . REPLACEMENT TOTAL KNEE Left   . TUBAL LIGATION  decades  . UMBILICAL HERNIA REPAIR  2004  . UPPER GASTROINTESTINAL ENDOSCOPY  02/26/2017   Distal esophageal stricture status post esophageal dilatation. Distal esophageal diverticula. Presbyesophagus. Hiatal hernia. Gastric polyps (status post polypectomy x2)    Family History  Problem Relation Age of Onset  . Ulcerative colitis Daughter   . Colon cancer Neg Hx   . Colon polyps Neg Hx   . Rectal cancer Neg Hx   . Stomach cancer Neg Hx   . Esophageal cancer Neg Hx     Social History   Tobacco Use  . Smoking status: Former Smoker    Types: Cigarettes    Quit date: 1994    Years since quitting: 28.0  . Smokeless tobacco: Never Used  Vaping Use  . Vaping Use: Never used  Substance Use Topics  . Alcohol use: Yes    Comment:  occasionally  . Drug use: Never    Current Outpatient Medications  Medication Sig Dispense Refill  . amoxicillin (AMOXIL) 500 MG capsule Take 500 mg by mouth 4 (four) times daily as needed (when going to the dentist).    Marland Kitchen aspirin EC 81 MG tablet Take by mouth.    . cloNIDine (CATAPRES) 0.1 MG tablet Take 0.2 mg by mouth as needed.    . cloNIDine (CATAPRES) 0.3 MG tablet     . levocetirizine (XYZAL) 5 MG tablet Take 5 mg by mouth daily.  3  . mesalamine (APRISO) 0.375 g 24 hr capsule TAKE FOUR CAPSULES BY MOUTH EVERY DAY 120 capsule 3  . nystatin cream (MYCOSTATIN) APPLY TWICE DAILY TO AFFECTED AREAS    . pantoprazole (PROTONIX) 40 MG tablet Take 1  tablet (40 mg total) by mouth 2 (two) times daily. 60 tablet 11  . rosuvastatin (CRESTOR) 10 MG tablet Take 10 mg by mouth at bedtime.    Marland Kitchen venlafaxine (EFFEXOR) 75 MG tablet Take 225 mg by mouth daily.     No current facility-administered medications for this visit.    No Known Allergies  Review of Systems:  neg     Physical Exam:    BP 138/76   Pulse 81   Ht 5\' 3"  (1.6 m)   Wt 217 lb (98.4 kg)   BMI 38.44 kg/m  Wt Readings from Last 3 Encounters:  09/15/20 217 lb (98.4 kg)  02/11/20 220 lb 6 oz (100 kg)  12/23/19 225 lb (102.1 kg)   Constitutional:  Well-developed, in no acute distress. Psychiatric: Normal mood and affect. Behavior is normal. Abdominal: Soft, nondistended. Nontender. Bowel sounds active throughout. There are no masses palpable. No hepatomegaly. Rectal exam: Performed in presence of Trisha.  Small external hemorrhoid.  1 Channel of grade 4 internal hemorrhoids.  Nontender.  Stool brown heme-negative. Neurological: Alert and oriented to person place and time. Skin: Skin is warm and dry. No rashes noted.    Carmell Austria, MD 09/15/2020, 3:24 PM  Cc: Street, Sharon Mt, *

## 2020-09-15 NOTE — Patient Instructions (Addendum)
If you are age 79 or older, your body mass index should be between 23-30. Your Body mass index is 38.44 kg/m. If this is out of the aforementioned range listed, please consider follow up with your Primary Care Provider.  If you are age 6 or younger, your body mass index should be between 19-25. Your Body mass index is 38.44 kg/m. If this is out of the aformentioned range listed, please consider follow up with your Primary Care Provider.   Please purchase the following medications over the counter and take as directed: Benefiber 1 tablespoon once daily with 8 ounces of water.   We have sent the following medications to your pharmacy for you to pick up at your convenience: Fluticasone    How to Take a Sitz Bath A sitz bath is a warm water bath that may be used to care for your rectum, genital area, or the area between your rectum and genitals (perineum). For a sitz bath, the water only comes up to your hips and covers your buttocks. A sitz bath may done at home in a bathtub or with a portable sitz bath that fits over the toilet. Your health care provider may recommend a sitz bath to help:  Relieve pain and discomfort after delivering a baby.  Relieve pain and itching from hemorrhoids or anal fissures.  Relieve pain after certain surgeries.  Relax muscles that are sore or tight. How to take a sitz bath Take 3-4 sitz baths a day, or as many as told by your health care provider. Bathtub sitz bath To take a sitz bath in a bathtub: 1. Partially fill a bathtub with warm water. The water should be deep enough to cover your hips and buttocks when you are sitting in the tub. 2. If your health care provider told you to put medicine in the water, follow his or her instructions. 3. Sit in the water. 4. Open the tub drain a little, and leave it open during your bath. 5. Turn on the warm water again, enough to replace the water that is draining out. Keep the water running throughout your bath. This  helps keep the water at the right level and the right temperature. 6. Soak in the water for 15-20 minutes, or as long as told by your health care provider. 7. When you are done, be careful when you stand up. You may feel dizzy. 8. After the sitz bath, pat yourself dry. Do not rub your skin to dry it.  Over-the-toilet sitz bath To take a sitz bath with an over-the-toilet basin: 1. Follow the manufacturer's instructions. 2. Fill the basin with warm water. 3. If your health care provider told you to put medicine in the water, follow his or her instructions. 4. Sit on the seat. Make sure the water covers your buttocks and perineum. 5. Soak in the water for 15-20 minutes, or as long as told by your health care provider. 6. After the sitz bath, pat yourself dry. Do not rub your skin to dry it. 7. Clean and dry the basin between uses. 8. Discard the basin if it cracks, or according to the manufacturer's instructions. Contact a health care provider if:  Your symptoms get worse. Do not continue with sitz baths if your symptoms get worse.  You have new symptoms. If this happens, do not continue with sitz baths until you talk with your health care provider. Summary  A sitz bath is a warm water bath in which the water only comes  up to your hips and covers your buttocks.  A sitz bath may help relieve itching, relieve pain, and relax muscles that are sore or tight in the lower part of your body, including your genital area.  Take 3-4 sitz baths a day, or as many as told by your health care provider. Soak in the water for 15-20 minutes.  Do not continue with sitz baths if your symptoms get worse. This information is not intended to replace advice given to you by your health care provider. Make sure you discuss any questions you have with your health care provider. Document Revised: 01/27/2019 Document Reviewed: 08/30/2017 Elsevier Patient Education  2020 [employer]   Thank you,  Dr. Lynann Bologna

## 2020-09-21 ENCOUNTER — Other Ambulatory Visit: Payer: Self-pay | Admitting: Gastroenterology

## 2020-09-22 DIAGNOSIS — F331 Major depressive disorder, recurrent, moderate: Secondary | ICD-10-CM | POA: Diagnosis not present

## 2020-10-04 DIAGNOSIS — G4733 Obstructive sleep apnea (adult) (pediatric): Secondary | ICD-10-CM | POA: Diagnosis not present

## 2020-10-18 DIAGNOSIS — F331 Major depressive disorder, recurrent, moderate: Secondary | ICD-10-CM | POA: Diagnosis not present

## 2020-10-25 DIAGNOSIS — Z6838 Body mass index (BMI) 38.0-38.9, adult: Secondary | ICD-10-CM | POA: Diagnosis not present

## 2020-10-25 DIAGNOSIS — F331 Major depressive disorder, recurrent, moderate: Secondary | ICD-10-CM | POA: Diagnosis not present

## 2020-10-25 DIAGNOSIS — K5190 Ulcerative colitis, unspecified, without complications: Secondary | ICD-10-CM | POA: Diagnosis not present

## 2020-10-25 DIAGNOSIS — I10 Essential (primary) hypertension: Secondary | ICD-10-CM | POA: Diagnosis not present

## 2020-12-13 DIAGNOSIS — H903 Sensorineural hearing loss, bilateral: Secondary | ICD-10-CM | POA: Diagnosis not present

## 2020-12-22 ENCOUNTER — Other Ambulatory Visit: Payer: Self-pay | Admitting: Gastroenterology

## 2020-12-22 DIAGNOSIS — H903 Sensorineural hearing loss, bilateral: Secondary | ICD-10-CM | POA: Diagnosis not present

## 2020-12-27 DIAGNOSIS — F331 Major depressive disorder, recurrent, moderate: Secondary | ICD-10-CM | POA: Diagnosis not present

## 2020-12-27 DIAGNOSIS — Z6838 Body mass index (BMI) 38.0-38.9, adult: Secondary | ICD-10-CM | POA: Diagnosis not present

## 2020-12-27 DIAGNOSIS — I10 Essential (primary) hypertension: Secondary | ICD-10-CM | POA: Diagnosis not present

## 2021-01-21 DIAGNOSIS — G319 Degenerative disease of nervous system, unspecified: Secondary | ICD-10-CM | POA: Diagnosis not present

## 2021-01-21 DIAGNOSIS — J019 Acute sinusitis, unspecified: Secondary | ICD-10-CM | POA: Diagnosis not present

## 2021-01-21 DIAGNOSIS — H748X3 Other specified disorders of middle ear and mastoid, bilateral: Secondary | ICD-10-CM | POA: Diagnosis not present

## 2021-01-21 DIAGNOSIS — H9319 Tinnitus, unspecified ear: Secondary | ICD-10-CM | POA: Diagnosis not present

## 2021-01-21 DIAGNOSIS — J3489 Other specified disorders of nose and nasal sinuses: Secondary | ICD-10-CM | POA: Diagnosis not present

## 2021-01-21 DIAGNOSIS — H903 Sensorineural hearing loss, bilateral: Secondary | ICD-10-CM | POA: Diagnosis not present

## 2021-01-21 DIAGNOSIS — H9190 Unspecified hearing loss, unspecified ear: Secondary | ICD-10-CM | POA: Diagnosis not present

## 2021-03-10 ENCOUNTER — Telehealth: Payer: Self-pay | Admitting: Gastroenterology

## 2021-03-10 NOTE — Telephone Encounter (Signed)
Inbound call from patient daughter Abigail Butts. States patient have had 2 episodes of constipation in the last month and is relieved by using Doculax. States there has been no change in diet. Would like a call back to  know if patient needs to increase fiber or have an OV? Best contact number [phone]

## 2021-03-16 NOTE — Telephone Encounter (Signed)
Spoke with Emily Hancock, Per Emily Hancock pt states she has had approximately 2 "episodes" of constipation in last month. Emily Hancock states the constipation if verful and does relieve when she takes dulcolax. Requesting rec's as to increasing Fiber, dulcolax, etc Please advise, thank you     Last OV: 09/15/20 ASSESSMENT AND PLAN;    #1. Hoids (Gd IV)   Plan: -Benefiber 1 TBS p.o. QD with 8 oz of water. -fluticasone cream 0.05% generic 30g 1 bid PR x 10 days, 2 refills -Sitz baths BID x 7 days. -Avoid NSAIDs. -Call if still with problems in 2 weeks. -If still with problems, consider surgical referral to Dr. Amalia Hailey for EUA/hemorrhoidectomy.   #2. 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  Barium swallow and EGD also showed multiple esophageal diverticula, large hiatal hernia.   -Continue Protonix 40 mg p.o. once a day. -Repeat EGD with dilatation on as-needed basis.   #3.  Quiescent left sided UC. Dx 2000. Colon 05/2018 showed mucosal healing.

## 2021-03-18 NOTE — Telephone Encounter (Signed)
Please advise as Doc of the Day, thank you   Spoke with Emily Hancock, Per Emily Hancock pt states she has had approximately 2 "episodes" of constipation in last month. Emily Hancock states the constipation if verful and does relieve when she takes dulcolax. Requesting rec's as to increasing Fiber, dulcolax, etc Please advise, thank you

## 2021-03-18 NOTE — Telephone Encounter (Signed)
She should double her fiber supplement and try to stay as hydrated as possible.  She should call back in 1 to 2 weeks to report on her response.  Thanks

## 2021-03-18 NOTE — Telephone Encounter (Signed)
Spoke with Emily Hancock. [name] of Rec's per MD. Emily Hancock verbalized understanding and nothing further needed at this time.

## 2021-04-01 ENCOUNTER — Other Ambulatory Visit: Payer: Self-pay | Admitting: Gastroenterology

## 2021-04-26 ENCOUNTER — Other Ambulatory Visit: Payer: Self-pay | Admitting: Gastroenterology

## 2021-06-03 DIAGNOSIS — R051 Acute cough: Secondary | ICD-10-CM | POA: Diagnosis not present

## 2021-06-03 DIAGNOSIS — R519 Headache, unspecified: Secondary | ICD-10-CM | POA: Diagnosis not present

## 2021-06-03 DIAGNOSIS — Z20828 Contact with and (suspected) exposure to other viral communicable diseases: Secondary | ICD-10-CM | POA: Diagnosis not present

## 2021-06-03 DIAGNOSIS — K591 Functional diarrhea: Secondary | ICD-10-CM | POA: Diagnosis not present

## 2021-06-03 DIAGNOSIS — K529 Noninfective gastroenteritis and colitis, unspecified: Secondary | ICD-10-CM | POA: Diagnosis not present

## 2021-06-11 DIAGNOSIS — K591 Functional diarrhea: Secondary | ICD-10-CM | POA: Diagnosis not present

## 2021-06-11 DIAGNOSIS — R197 Diarrhea, unspecified: Secondary | ICD-10-CM | POA: Diagnosis not present

## 2021-06-13 DIAGNOSIS — R197 Diarrhea, unspecified: Secondary | ICD-10-CM | POA: Diagnosis not present

## 2021-06-13 DIAGNOSIS — R7401 Elevation of levels of liver transaminase levels: Secondary | ICD-10-CM | POA: Diagnosis not present

## 2021-06-13 DIAGNOSIS — R109 Unspecified abdominal pain: Secondary | ICD-10-CM | POA: Diagnosis not present

## 2021-06-13 DIAGNOSIS — K591 Functional diarrhea: Secondary | ICD-10-CM | POA: Diagnosis not present

## 2021-06-20 DIAGNOSIS — Z6836 Body mass index (BMI) 36.0-36.9, adult: Secondary | ICD-10-CM | POA: Diagnosis not present

## 2021-06-20 DIAGNOSIS — R7989 Other specified abnormal findings of blood chemistry: Secondary | ICD-10-CM | POA: Diagnosis not present

## 2021-06-20 DIAGNOSIS — K529 Noninfective gastroenteritis and colitis, unspecified: Secondary | ICD-10-CM | POA: Diagnosis not present

## 2021-06-27 DIAGNOSIS — K760 Fatty (change of) liver, not elsewhere classified: Secondary | ICD-10-CM | POA: Diagnosis not present

## 2021-06-27 DIAGNOSIS — R7989 Other specified abnormal findings of blood chemistry: Secondary | ICD-10-CM | POA: Diagnosis not present

## 2021-07-13 DIAGNOSIS — R7989 Other specified abnormal findings of blood chemistry: Secondary | ICD-10-CM | POA: Diagnosis not present

## 2021-07-19 DIAGNOSIS — N952 Postmenopausal atrophic vaginitis: Secondary | ICD-10-CM | POA: Diagnosis not present

## 2021-07-19 DIAGNOSIS — Z2821 Immunization not carried out because of patient refusal: Secondary | ICD-10-CM | POA: Diagnosis not present

## 2021-07-19 DIAGNOSIS — Z6836 Body mass index (BMI) 36.0-36.9, adult: Secondary | ICD-10-CM | POA: Diagnosis not present

## 2021-07-19 DIAGNOSIS — K760 Fatty (change of) liver, not elsewhere classified: Secondary | ICD-10-CM | POA: Diagnosis not present

## 2021-07-19 DIAGNOSIS — L292 Pruritus vulvae: Secondary | ICD-10-CM | POA: Diagnosis not present

## 2021-07-22 ENCOUNTER — Telehealth: Payer: Self-pay

## 2021-07-22 NOTE — Telephone Encounter (Signed)
Patient made aware that her [place] from 06-27-2021 shows mild hepatic steatosis.

## 2021-07-22 NOTE — Telephone Encounter (Signed)
Spoke to [employer] patient's POA said they would like to cancel appt f or 11-30 because her liver enzymes came back normal and that's why they were referred. They don't want the appt anymore

## 2021-07-25 ENCOUNTER — Encounter: Payer: Self-pay | Admitting: Family Medicine

## 2021-08-10 ENCOUNTER — Ambulatory Visit: Payer: PPO | Admitting: Gastroenterology

## 2021-08-29 ENCOUNTER — Other Ambulatory Visit: Payer: Self-pay | Admitting: Gastroenterology

## 2021-08-29 DIAGNOSIS — M461 Sacroiliitis, not elsewhere classified: Secondary | ICD-10-CM | POA: Diagnosis not present

## 2021-08-29 DIAGNOSIS — M1711 Unilateral primary osteoarthritis, right knee: Secondary | ICD-10-CM | POA: Diagnosis not present

## 2021-08-29 DIAGNOSIS — Z6836 Body mass index (BMI) 36.0-36.9, adult: Secondary | ICD-10-CM | POA: Diagnosis not present

## 2021-08-29 DIAGNOSIS — Z0000 Encounter for general adult medical examination without abnormal findings: Secondary | ICD-10-CM | POA: Diagnosis not present

## 2021-08-29 DIAGNOSIS — Z9181 History of falling: Secondary | ICD-10-CM | POA: Diagnosis not present

## 2021-08-29 DIAGNOSIS — D367 Benign neoplasm of other specified sites: Secondary | ICD-10-CM | POA: Diagnosis not present

## 2021-09-06 DIAGNOSIS — R7301 Impaired fasting glucose: Secondary | ICD-10-CM | POA: Diagnosis not present

## 2021-09-06 DIAGNOSIS — Z79899 Other long term (current) drug therapy: Secondary | ICD-10-CM | POA: Diagnosis not present

## 2021-09-06 DIAGNOSIS — E785 Hyperlipidemia, unspecified: Secondary | ICD-10-CM | POA: Diagnosis not present

## 2021-09-27 DIAGNOSIS — I10 Essential (primary) hypertension: Secondary | ICD-10-CM | POA: Diagnosis not present

## 2021-09-27 DIAGNOSIS — M722 Plantar fascial fibromatosis: Secondary | ICD-10-CM | POA: Diagnosis not present

## 2021-11-22 DIAGNOSIS — L820 Inflamed seborrheic keratosis: Secondary | ICD-10-CM | POA: Diagnosis not present

## 2021-11-22 DIAGNOSIS — L209 Atopic dermatitis, unspecified: Secondary | ICD-10-CM | POA: Diagnosis not present

## 2021-11-30 ENCOUNTER — Other Ambulatory Visit: Payer: Self-pay | Admitting: Gastroenterology

## 2021-12-07 ENCOUNTER — Ambulatory Visit (INDEPENDENT_AMBULATORY_CARE_PROVIDER_SITE_OTHER): Payer: Medicare Other | Admitting: Gastroenterology

## 2021-12-07 ENCOUNTER — Encounter: Payer: Self-pay | Admitting: Gastroenterology

## 2021-12-07 ENCOUNTER — Other Ambulatory Visit: Payer: Self-pay

## 2021-12-07 VITALS — BP 126/76 | HR 64 | Ht 63.0 in | Wt 208.2 lb

## 2021-12-07 DIAGNOSIS — K2100 Gastro-esophageal reflux disease with esophagitis, without bleeding: Secondary | ICD-10-CM | POA: Diagnosis not present

## 2021-12-07 DIAGNOSIS — R1319 Other dysphagia: Secondary | ICD-10-CM

## 2021-12-07 DIAGNOSIS — K51919 Ulcerative colitis, unspecified with unspecified complications: Secondary | ICD-10-CM

## 2021-12-07 MED ORDER — MESALAMINE ER 0.375 G PO CP24: 1500.0000 mg | ORAL_CAPSULE | Freq: Every day | ORAL | 5 refills | Status: DC

## 2021-12-07 MED ORDER — PANTOPRAZOLE SODIUM 40 MG PO TBEC: 40.0000 mg | DELAYED_RELEASE_TABLET | Freq: Two times a day (BID) | ORAL | 5 refills | Status: DC

## 2021-12-07 NOTE — Progress Notes (Signed)
? ? ?Chief Complaint: FU ? ?Referring Provider:  Street, Sharon Mt, *   ? ? ?ASSESSMENT AND PLAN;  ? ?#1. GERD with eso stricture s/p dil 12/2019 (18 mm) with resolution of dysphagia.  Barium swallow and EGD also showed multiple esophageal diverticula, large hiatal hernia.  ? ? ?#2.  Quiescent left sided UC. Dx 2000. Colon 05/2018 showed mucosal healing. ? ?Plan: ?-Continue mesalamine (Apriso)0.375g 4/day #360, 4 RF ?-Protonix '40mg'$  po BID #180, 4RF ?-Call if any problems ?-D/W pt and daughter. ? ?HPI:   ? ?Emily Hancock is a 81 y.o. female  ? ?For follow-up visit. ? ?No further dysphagia.  Has been taking Protonix BID. Not willing to reduce the dose as she is doing great.  "Do not rock the boat". ? ?Denies having any diarrhea or constipation ? ?Here for prescription refill. ? ?Accompanied by her daughter who takes good care of her. ? ?No fever chills or night sweats. ? ?No weight loss. ? ?Had blood tests done as a part of routine physical in December 2022 per patient's daughter with Dr. Shela Commons were negative per daughter. ? ? ? ?Past GI procedures/history: ? ?EGD 12/23/2019 ?- Benign-appearing esophageal stenosis with esophageal diverticula. Dilated x 18 mm balloon. ?- Large hiatal hernia. ? ?Dx with left-sided ulcerative colitis by Dr. Veneta Penton 05/1999.  Has been treated with Apriso.  Most recent colonoscopy (see under procedures tab) 05/2018 with complete mucosal healing. Bx-Quiescent colitis ? ?-EGD 12/2019 : 6 cm hiatal hernia, distal esophageal stricture with multiple esophageal diverticula s/p dilatation to TTS 18 mm balloon, fundic gland polyps. ? ? ?Past Medical History:  ?Diagnosis Date  ? Allergy   ? Asthma   ? C. difficile colitis   ? Cataract   ? bilateral  ? Chronic headache   ? COPD (chronic obstructive pulmonary disease) ([place])   ? Depression   ? Family history of colon cancer   ? Mother-died at 45. first cousin with colon cancer   ? GERD (gastroesophageal reflux disease)   ? Hyperlipidemia   ? past hx   ? Hypertension   ? IBS (irritable bowel syndrome)   ? 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 Sleep apnea   ? sleeps with cpap  ? 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 ? ?Past Surgical History:  ?Procedure Laterality Date  ? CATARACT EXTRACTION  5 years  ? CHOLECYSTECTOMY  2008  ? COLONOSCOPY    ? POLYPECTOMY    ? REPLACEMENT TOTAL KNEE Left   ? TUBAL LIGATION  decades  ? UMBILICAL HERNIA REPAIR  2004  ? UPPER GASTROINTESTINAL ENDOSCOPY  02/26/2017  ? Distal esophageal stricture status post esophageal dilatation. Distal esophageal diverticula. Presbyesophagus. Hiatal hernia. Gastric polyps (status post polypectomy x2)  ? ? ?Family History  ?Problem Relation Age of Onset  ? Ulcerative colitis Daughter   ? Colon cancer Neg Hx   ? Colon polyps Neg Hx   ? Rectal cancer Neg Hx   ? Stomach cancer Neg Hx   ? Esophageal cancer Neg Hx   ? ? ?Social History  ? ?Tobacco Use  ? Smoking status: Former  ?  Types: Cigarettes  ?  Quit date: 32  ?  Years since quitting: 29.2  ? Smokeless tobacco: Never  ?Vaping Use  ? Vaping Use: Never used  ?Substance Use Topics  ? Alcohol use: Yes  ?  Comment: occasionally  ? Drug use: Never  ? ? ?Current Outpatient Medications  ?Medication Sig Dispense Refill  ? amoxicillin (  AMOXIL) 500 MG capsule Take 500 mg by mouth 4 (four) times daily as needed (when going to the dentist).    ? aspirin EC 81 MG tablet Take by mouth.    ? cloNIDine (CATAPRES) 0.1 MG tablet Take 0.2 mg by mouth as needed.    ? cloNIDine (CATAPRES) 0.3 MG tablet     ? levocetirizine (XYZAL) 5 MG tablet Take 5 mg by mouth daily.  3  ? mesalamine (APRISO) 0.375 g 24 hr capsule TAKE FOUR CAPSULES BY MOUTH EVERY DAY. PLEASE CALL [phone] TO SCHEDULE AN OFFICE VISIT FOR MORE REFILLS. 120 capsule 0  ? nystatin cream (MYCOSTATIN) APPLY TWICE DAILY TO AFFECTED AREAS    ? pantoprazole (PROTONIX) 40 MG tablet TAKE 1 TABLET BY MOUTH TWICE A DAY 180 tablet 3  ? rosuvastatin (CRESTOR) 10 MG tablet Take 10 mg by mouth at  bedtime.    ? venlafaxine (EFFEXOR) 75 MG tablet Take 225 mg by mouth daily.    ? ?No current facility-administered medications for this visit.  ? ? ?No Known Allergies ? ?Review of Systems:  ?neg ? ?  ? ?Physical Exam:   ? ?Ht '5\' 3"'$  (1.6 m)   Wt 208 lb 4 oz (94.5 kg)   BMI 36.89 kg/m?  ?Wt Readings from Last 3 Encounters:  ?12/07/21 208 lb 4 oz (94.5 kg)  ?09/15/20 217 lb (98.4 kg)  ?02/11/20 220 lb 6 oz (100 kg)  ? ?Constitutional:  Well-developed, in no acute distress. ?Psychiatric: Normal mood and affect. Behavior is normal. ?Abdominal: Soft, nondistended. Nontender. Bowel sounds active throughout. There are no masses palpable. No hepatomegaly. ?Neurological: Alert and oriented to person place and time. ?Skin: Skin is warm and dry. No rashes noted. ? ? ? ?Carmell Austria, MD 12/07/2021, 9:17 AM ? ?Cc: Street, [place], * ? ? ? ? ? ?

## 2021-12-07 NOTE — Patient Instructions (Signed)
If you are age 80 or older, your body mass index should be between 23-30. Your Body mass index is 36.89 kg/m?Marland Kitchen If this is out of the aforementioned range listed, please consider follow up with your Primary Care Provider. ? ?If you are age 42 or younger, your body mass index should be between 19-25. Your Body mass index is 36.89 kg/m?Marland Kitchen If this is out of the aformentioned range listed, please consider follow up with your Primary Care Provider.  ? ?________________________________________________________ ? ?The [facility] providers would like to encourage you to use [facility] to communicate with providers for non-urgent requests or questions.  Due to long hold times on the telephone, sending your provider a message by [facility] may be a faster and more efficient way to get a response.  Please allow 48 business hours for a response.  Please remember that this is for non-urgent requests.  ?_______________________________________________________ ? ?We have sent the following medications to your pharmacy for you to pick up at your convenience: ?Montverde and protonix. ? ?Call with any questions. ? ?Thank you, ? ?Dr. Jackquline Denmark ? ? ? ? ? ?We want to thank you for trusting [facility] with your care. All of our staff and providers value the relationships we have built with our patients, and it is an honor to care for you.  ? ?We are writing to let you know that [facility] will close on Jan 23, 2022, and we invite you to continue to see Dr. Carmell Austria and Gerrit Heck at the [facility] [place] office location. We are consolidating our serices at these [facility] practices to better provide care. Our office staff will work with you to ensure a seamless transition.  ? ?Gerrit Heck, DO -Dr. Bryan Lemma will be movig to [facility] at [address], [place], [place] [address], effective Jan 23, 2022.  Contact [phone] to schedule an appointment with  him.  ? ?Carmell Austria, MD- Dr. Lyndel Safe will be movig to [facility] at [address], [place],  [address], effective Jan 23, 2022.  Contact [phone] to schedule an appointment with him.  ? ?Requesting Medical Records ?If you need to request your medical records, please follow the instructions below. Your medical records are confidential, and a copy can be transferred to another provider or released to you or another person you designate only with your permission. ? ?There are several ways to request your medical records: ?Requests for medical records can be submitted through our practice.   ?You can also request your records electronically, in your MyChart account by selecting the ?Request Health Records? tab.  ?If you need additional information on how to request records, please go to [email or web address]/, choose Patient Information, then select Request Medical Records. ?To make an appointment or if you have any questions about your health care needs, please contact our office at [phone] and one of our staff members will be glad to assist you. ?[place] [facility] is committed to providing exceptional care for you and our community. Thank you for allowing [place] to serve your health care needs. ?Sincerely, ? ?Windy Canny, Director [facility] ?[facility] also offers convenient virtual care options. Sore throat? Sinus problems? Cold or flu symptoms? Get care from the comfort of home with [facility] Video Visits and e-Visits. Learn more about the non-emergency conditions treated and start your virtual visit at [email or web address] ? ?

## 2022-01-16 IMAGING — RF DG ESOPHAGUS
11 of 17 series · 13 of 24 positions shown · non-contrast
Comparison: Esophagram 12/03/2016

CLINICAL DATA: Gastroesophageal reflux disease, unspecified whether
esophagitis present. Dysphagia, unspecified type. GERD with
esophageal dysphagia. Additional history: The patient reports
feeling as though food is getting stuck in the region of her throat.

EXAM:
ESOPHOGRAM / BARIUM SWALLOW / BARIUM TABLET STUDY
TECHNIQUE: Combined double contrast and single contrast examination performed
utilizing thick barium liquid, and thin barium liquid. The patient
was observed with fluoroscopy swallowing a 13 mm barium sulphate
tablet.
FLUOROSCOPY TIME:  Fluoroscopy Time:  3.9 minutes
Radiation Exposure Index (if provided by the fluoroscopic device):
81.50 mGy
Number of Acquired Spot Images: 11

[Series 1: cp_standard · 0.17mm/px · 1 of 1 slices shown (1 of 8)]
[im 1/1]
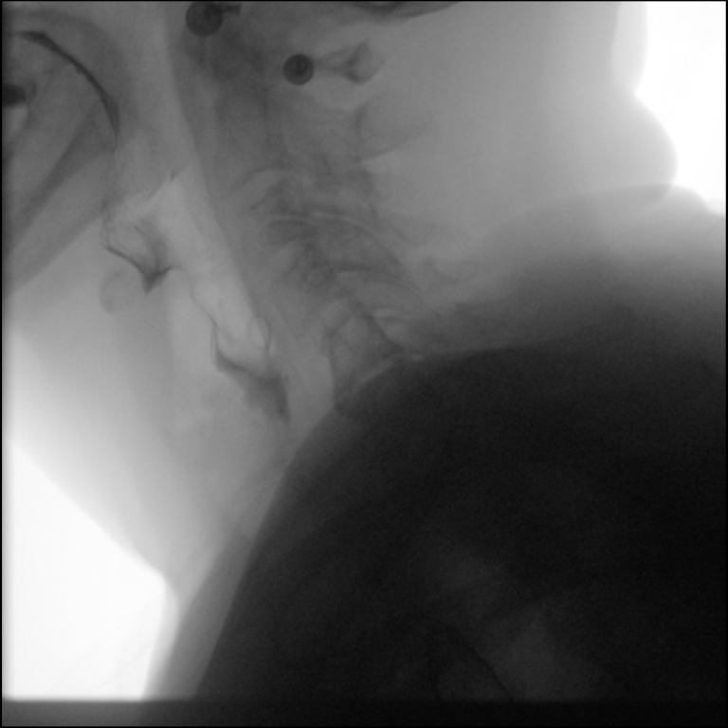

[Series 2: cp_standard · 0.34mm/px · 1 of 57 frames shown (2 of 8)]
[frame 36/57]
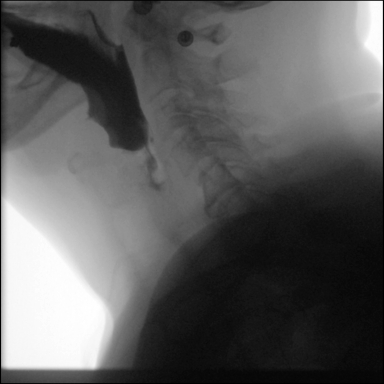

[Series 3: cp_standard · 0.34mm/px · 1 of 63 frames shown (3 of 8)]
[frame 32/63]
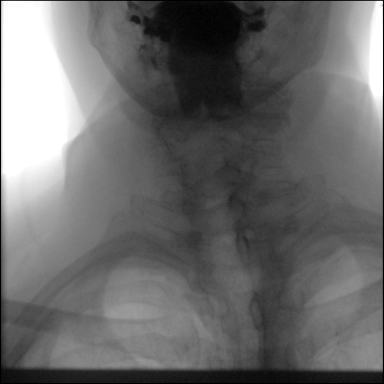

[Series 5: cp_standard · 0.17mm/px · 1 of 1 slices shown (4 of 8)]
[im 1/1]
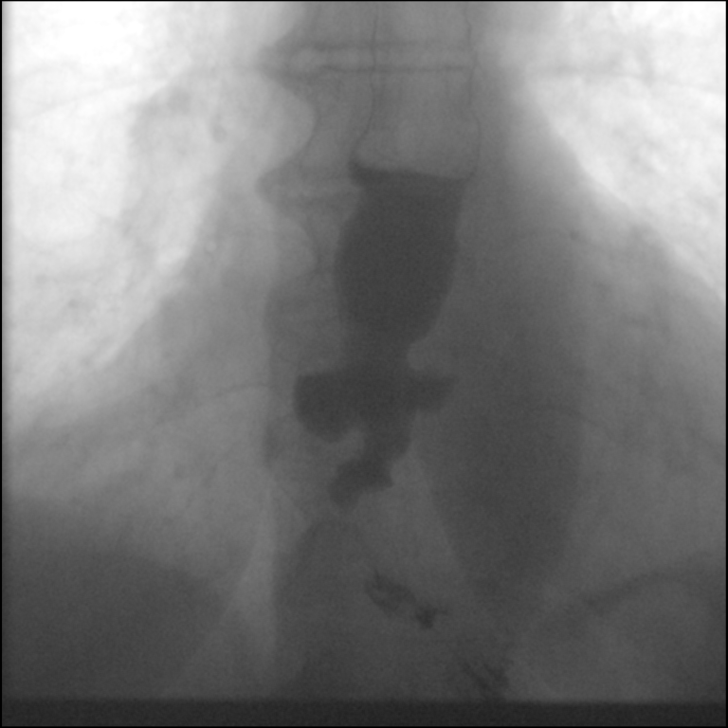

[Series 6: cp_standard · 0.34mm/px · 1 of 80 frames shown (5 of 8)]
[frame 69/80]
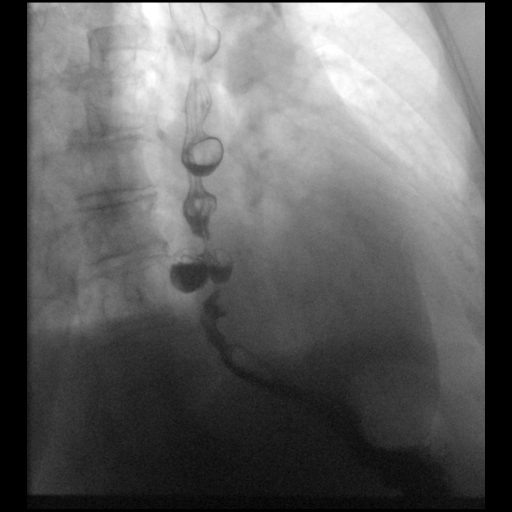

[Series 8: fluoro_barium 2fps_bw · 0.17mm/px · 1 of 2 frames shown (1 of 3)]
[frame 2/2]
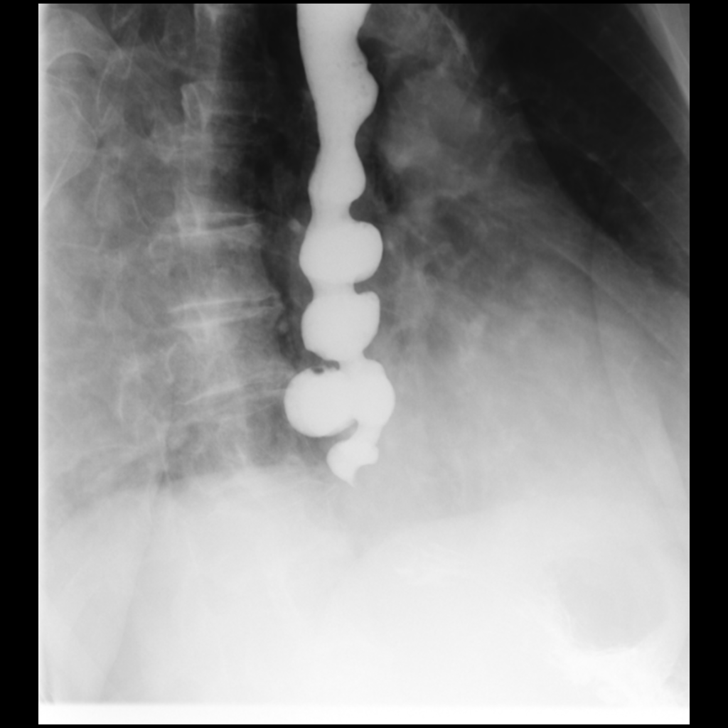

[Series 10: cp_standard · 0.17mm/px · 2 of 2 slices shown (6 of 8)]
[im 1/2]
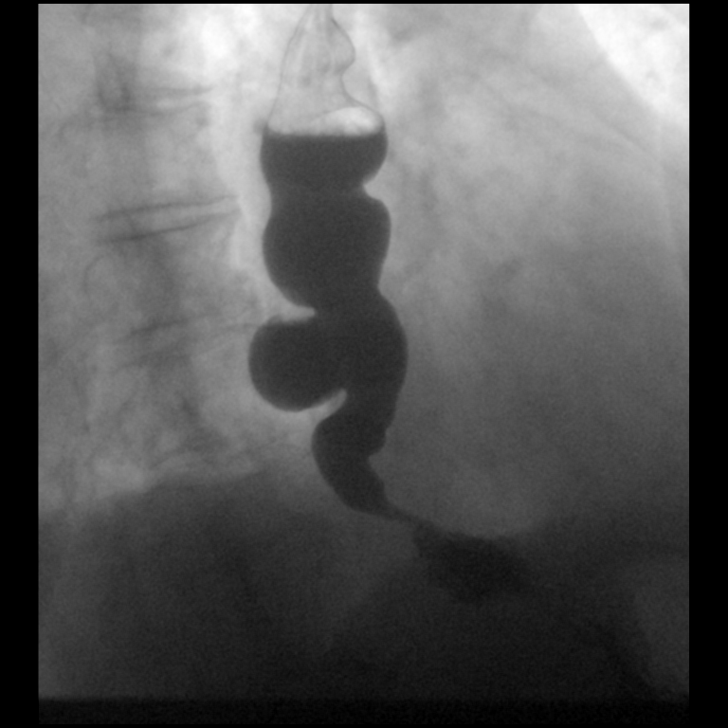
[im 2/2]
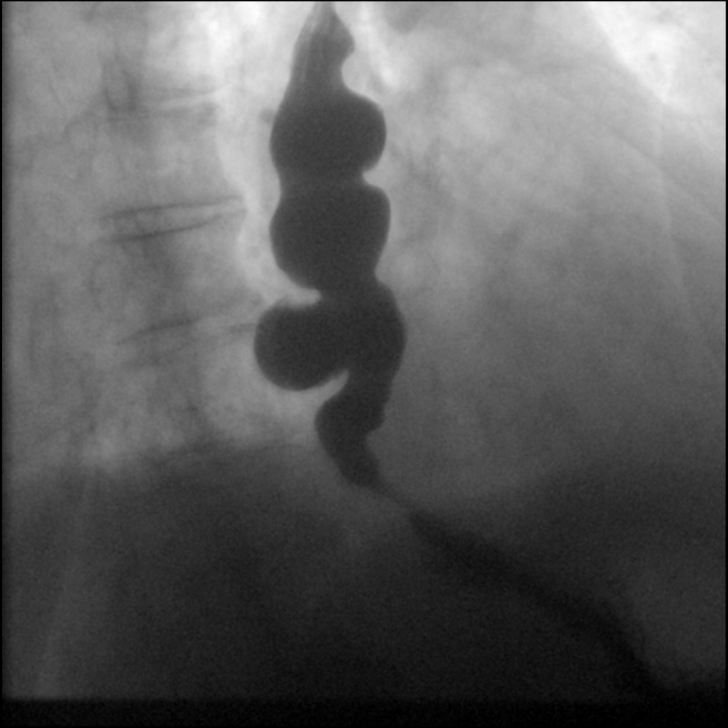

[Series 14: cp_standard · 0.19mm/px · 1 of 1 slices shown (7 of 8)]
[im 1/1]
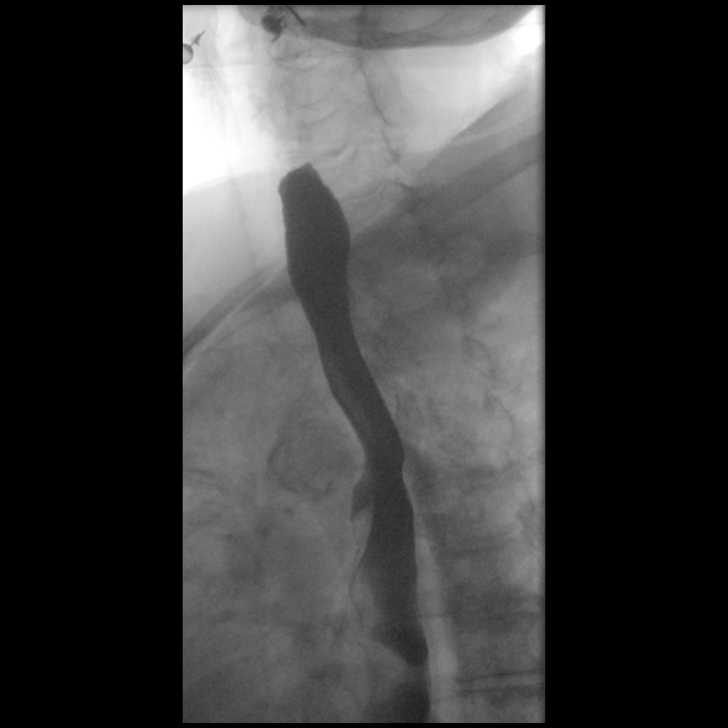

[Series 18: fluoro_barium 2fps_bw · 0.19mm/px · 1 of 2 frames shown (2 of 3)]
[frame 1/2]
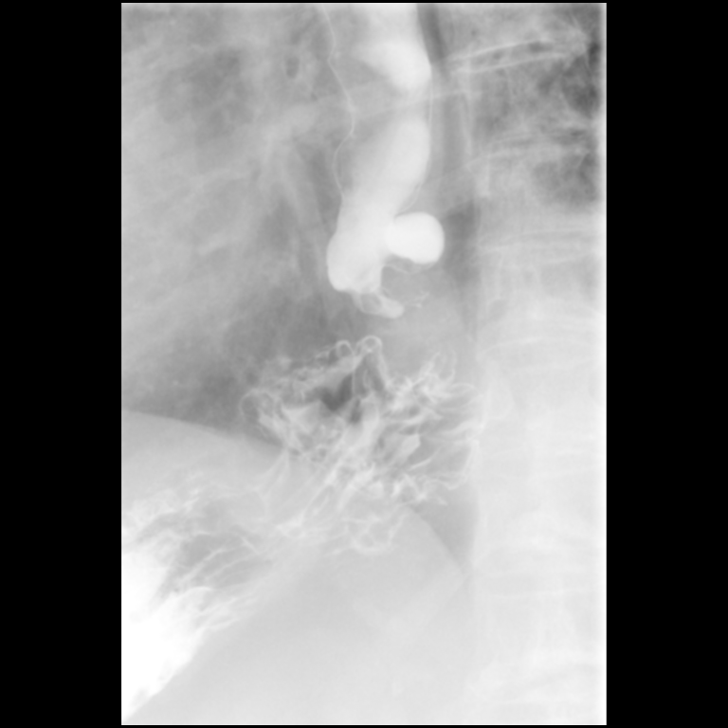

[Series 20: fluoro_barium 2fps_bw · 0.20mm/px · 1 of 1 slices shown (3 of 3)]
[im 1/1]
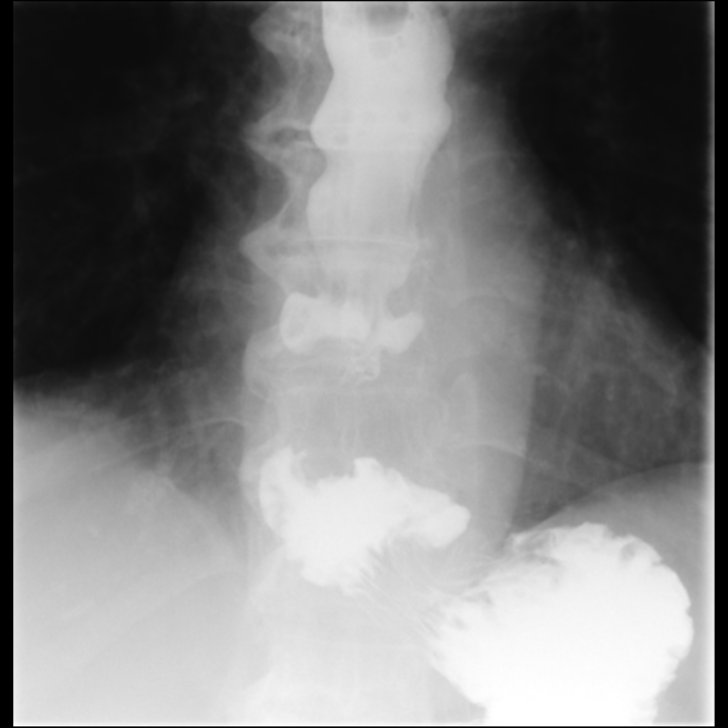

[Series 22: cp_standard · 0.41mm/px · 2 of 62 frames shown (8 of 8)]
[frame 2/62]
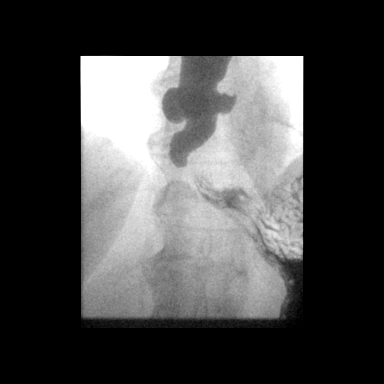
[frame 53/62]
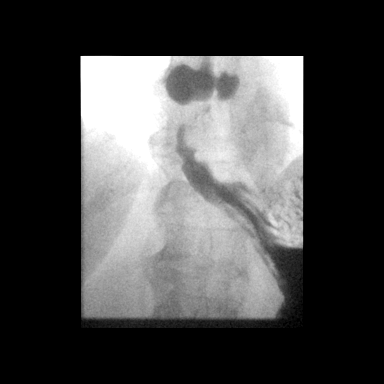

[13 of 24 positions shown; findings below may reference images not displayed]

FINDINGS: There is prominent intermittent esophageal dysmotility. Again
demonstrated are multiple diverticula arising from the mid to distal
esophagus (numbering at least 5), which appear increased in size and
number as compared to the esophagram of 02/02/2017. As before, the
largest diverticulum is located just above the esophagogastric
junction. There is suggestion of possible mild smooth narrowing of
the distal esophagus just proximal to the esophagogastric junction,
although a 13 mm barium tablet passes freely into the stomach. There
is incomplete esophageal stripping without definite gastroesophageal
reflux. A moderate-sized hiatal hernia appears slightly increased in
size as compared to 02/02/2017.
IMPRESSION: Multiple pulsion-type diverticula arising from the mid to distal
esophagus (numbering at least 5), increased in size and number as
compared to the examination of 12/03/2016. Associated prominent
intermittent esophageal dysmotility with incomplete esophageal
stripping.

Possible mild smooth narrowing of the distal esophagus just proximal
to the esophagogastric junction, although a 13 mm barium tablet
passes freely into the stomach.

A moderate-sized hiatal hernia appears slightly increased in size as
compared to prior exam 12/03/2016.

## 2022-02-09 DIAGNOSIS — R6889 Other general symptoms and signs: Secondary | ICD-10-CM | POA: Diagnosis not present

## 2022-02-20 ENCOUNTER — Encounter: Payer: Self-pay | Admitting: Podiatry

## 2022-02-20 ENCOUNTER — Ambulatory Visit: Payer: Medicare Other | Admitting: Podiatry

## 2022-02-20 DIAGNOSIS — B070 Plantar wart: Secondary | ICD-10-CM

## 2022-02-20 DIAGNOSIS — Q828 Other specified congenital malformations of skin: Secondary | ICD-10-CM | POA: Diagnosis not present

## 2022-02-20 NOTE — Progress Notes (Signed)
  Subjective:  Patient ID: Emily Hancock, female    DOB: 1941/08/27,   MRN: [id number]  Chief Complaint  Patient presents with   Callouses    I have some spots on both feet and they are touchy and burns and throbs and I have tried over the counter and helps for a little while and I have gone to dermatologist and they have scrapped them    81 y.o. female presents for concern of lesions on the bottom of both of her feet that have been bothersome for 6 plus months. Relates she has tried corn pads and seen dermatology and has not had any relief. Has been using vaseline on them as well.  . Denies any other pedal complaints. Denies n/v/f/c.   Past Medical History:  Diagnosis Date   Allergy    Asthma    C. difficile colitis    Cataract    bilateral   Chronic headache    COPD (chronic obstructive pulmonary disease) ([facility])    Depression    Family history of colon cancer    Mother-died at 72. 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 CFT <3 seconds. Normal hair growth on digits. No edema.  Skin. No lacerations or abrasions bilateral feet. Multiple cored hyperkeratotic lesions noted to plantar feet bilateral. Plantar right hallux lesion Multiple capillary budding is noted throughout with cauliflower-like appearance and loss of skin tension lines within the lesion itself. Musculoskeletal: MMT 5/5 bilateral lower extremities in DF, PF, Inversion and Eversion. Deceased ROM in DF of ankle joint.  Neurological: Sensation intact to light touch.   Assessment:   1. Plantar wart   2. Porokeratosis      Plan:  Patient was evaluated and treated and all questions answered. -Discussed warts and  porokeratois and their etiology with patient and treatment options.  -Hyperkeratotic tissue was debrided with chisel without incident.  -Applied salycylic acid treatment to area with dressing. Advised to remove bandaging tomorrow.  -Recommend use of OTC compound W.  -Discussed future options such as laser treatment if unsuccessful.  -Advised good supportive shoes and inserts -Patient to return to office as needed. Lorenda Peck, DPM

## 2022-03-07 ENCOUNTER — Ambulatory Visit (INDEPENDENT_AMBULATORY_CARE_PROVIDER_SITE_OTHER): Payer: Medicare Other | Admitting: Gastroenterology

## 2022-03-07 ENCOUNTER — Encounter: Payer: Self-pay | Admitting: Gastroenterology

## 2022-03-07 VITALS — BP 130/78 | HR 73 | Ht 63.0 in | Wt 212.1 lb

## 2022-03-07 DIAGNOSIS — K219 Gastro-esophageal reflux disease without esophagitis: Secondary | ICD-10-CM | POA: Diagnosis not present

## 2022-03-07 MED ORDER — PANTOPRAZOLE SODIUM 40 MG PO TBEC: 40.0000 mg | DELAYED_RELEASE_TABLET | Freq: Two times a day (BID) | ORAL | 4 refills | Status: DC

## 2022-03-07 MED ORDER — MESALAMINE ER 0.375 G PO CP24: 1500.0000 mg | ORAL_CAPSULE | Freq: Every day | ORAL | 4 refills | Status: DC

## 2022-03-07 NOTE — Progress Notes (Signed)
Chief Complaint: FU  Referring Provider:  Street, Stephanie Coup, *     ASSESSMENT AND PLAN;   #1. 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 Now with recurrent dysphagia.  Barium swallow and EGD also showed multiple esophageal diverticula, large hiatal hernia.   #2. Quiescent left sided UC. Dx 2000. Colon 05/2018 showed mucosal healing.  Plan: -EGD with dil -Continue mesalamine (Apriso)0.375g 4/day #360, 4 RF -Protonix 40mg  po BID #180, 4RF -D/W pt and daughter.    Proceed with EGD. I have discussed the risks and benefits. The risks including rare risk of perforation, bleeding, missed UGI neoplasms, risks of anesthesia/sedation. Alternatives were given. Patient is aware and agrees to proceed. All the questions were answered. This will be scheduled in upcoming days. Consent forms were given for review.  HPI:    Emily Hancock is a 81 y.o. female   For follow-up visit. Having dysphagia.  Not as bad as before.  Mostly with solids. Has been compliant with Protonix twice daily.  Not willing to reduce the dose as she does not have any further heartburn. Would like to get dilation done again  Denies having any diarrhea or constipation  Accompanied by her daughter who takes good care of her.  No fever chills or night sweats.  No weight loss.  Had blood tests done as a part of routine physical in December 2022 per patient's daughter with Dr. Dorothey Baseman were negative per daughter.    Past GI procedures/history:  EGD 12/23/2019 - Benign-appearing esophageal stenosis with esophageal diverticula. Dilated x 18 mm balloon. - Large hiatal hernia.  Dx with left-sided ulcerative colitis by Dr. Sherian Maroon 05/1999.  Has been treated with Apriso.  Most recent colonoscopy (see under procedures tab) 05/2018 with complete mucosal healing. Bx-Quiescent colitis  -EGD 12/2019 : 6 cm hiatal hernia, distal esophageal stricture with multiple esophageal  diverticula s/p dilatation to TTS 18 mm balloon, fundic gland polyps.   Past Medical History:  Diagnosis Date   Allergy    Asthma    C. difficile colitis    Cataract    bilateral   Chronic headache    COPD (chronic obstructive pulmonary disease) ([facility])    Depression    Family history of colon cancer    Mother-died at 59. 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 Distal esophageal diverticula. Presbyesophagus. Hiatal hernia. Gastric polyps (status post polypectomy x2)    Family History  Problem Relation Age of Onset   Ulcerative colitis Daughter    Colon cancer Neg Hx    Colon polyps Neg Hx    Rectal cancer Neg Hx    Stomach cancer Neg Hx    Esophageal cancer Neg Hx     Social History   Tobacco Use   Smoking status: Former  Types: Cigarettes    Quit date: 67    Years since quitting: 29.5   Smokeless tobacco: Never  Vaping Use   Vaping Use: Never used  Substance Use Topics   Alcohol use: Yes    Comment: occasionally   Drug use: Never    Current Outpatient Medications  Medication Sig Dispense Refill   aspirin EC 81 MG tablet Take by mouth.     cloNIDine (CATAPRES) 0.1 MG tablet Take 0.1 mg by mouth as needed.     cloNIDine (CATAPRES) 0.3 MG tablet Take 0.3 mg by mouth at bedtime.     DULoxetine (CYMBALTA) 30 MG capsule Take 30 mg by mouth daily.     levocetirizine (XYZAL) 5 MG  tablet Take 5 mg by mouth daily.  3   mesalamine (APRISO) 0.375 g 24 hr capsule Take 4 capsules (1.5 g total) by mouth daily. 360 capsule 5   pantoprazole (PROTONIX) 40 MG tablet Take 1 tablet (40 mg total) by mouth 2 (two) times daily. 180 tablet 5   rosuvastatin (CRESTOR) 10 MG tablet Take 10 mg by mouth at bedtime.     venlafaxine (EFFEXOR) 75 MG tablet Take 225 mg by mouth daily.     No current facility-administered medications for this visit.    No Known Allergies  Review of Systems:  neg     Physical Exam:    BP 130/78   Pulse 73   Ht 5\' 3"  (1.6 m)   Wt 212 lb 2 oz (96.2 kg)   SpO2 94%   BMI 37.58 kg/m  Wt Readings from Last 3 Encounters:  03/07/22 212 lb 2 oz (96.2 kg)  12/07/21 208 lb 4 oz (94.5 kg)  09/15/20 217 lb (98.4 kg)   Constitutional:  Well-developed, in no acute distress. Psychiatric: Normal mood and affect. Behavior is normal. Abdominal: Soft, nondistended. Nontender. Bowel sounds active throughout. There are no masses palpable. No hepatomegaly. Neurological: Alert and oriented to person place and time. Skin: Skin is warm and dry. No rashes noted.    Edman Circle, MD 03/07/2022, 11:03 AM  Cc: Street, Stephanie Coup, *

## 2022-04-18 ENCOUNTER — Encounter: Payer: PPO | Admitting: Gastroenterology

## 2022-04-19 DIAGNOSIS — R609 Edema, unspecified: Secondary | ICD-10-CM | POA: Diagnosis not present

## 2022-04-20 ENCOUNTER — Encounter: Payer: Self-pay | Admitting: Certified Registered Nurse Anesthetist

## 2022-04-24 ENCOUNTER — Ambulatory Visit (AMBULATORY_SURGERY_CENTER): Payer: Medicare Other | Admitting: Gastroenterology

## 2022-04-24 ENCOUNTER — Encounter: Payer: Self-pay | Admitting: Gastroenterology

## 2022-04-24 VITALS — BP 143/54 | HR 63 | Temp 97.5°F | Resp 18 | Ht 63.0 in | Wt 212.0 lb

## 2022-04-24 DIAGNOSIS — K449 Diaphragmatic hernia without obstruction or gangrene: Secondary | ICD-10-CM | POA: Diagnosis not present

## 2022-04-24 DIAGNOSIS — R1310 Dysphagia, unspecified: Secondary | ICD-10-CM | POA: Diagnosis not present

## 2022-04-24 DIAGNOSIS — K219 Gastro-esophageal reflux disease without esophagitis: Secondary | ICD-10-CM | POA: Diagnosis not present

## 2022-04-24 DIAGNOSIS — K222 Esophageal obstruction: Secondary | ICD-10-CM | POA: Diagnosis not present

## 2022-04-24 MED ORDER — SODIUM CHLORIDE 0.9 % IV SOLN: 500.0000 mL | Freq: Once | INTRAVENOUS | Status: DC

## 2022-04-24 NOTE — Progress Notes (Signed)
Report given to PACU, vss 

## 2022-04-24 NOTE — Progress Notes (Signed)
Chief Complaint: FU  Referring Provider:  Street, Sharon Mt, *     ASSESSMENT AND PLAN;   #1. 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 Now with recurrent dysphagia.  Barium swallow and EGD also showed multiple esophageal diverticula, large hiatal hernia.   #2. Quiescent left sided UC. Dx 2000. Colon 05/2018 showed mucosal healing.  Plan: -EGD with dil -Continue mesalamine (Apriso)0.375g 4/day #360, 4 RF -Protonix 44m po BID #180, 4RF -D/W pt and daughter.    Proceed with EGD. I have discussed the risks and benefits. The risks including rare risk of perforation, bleeding, missed UGI neoplasms, risks of anesthesia/sedation. Alternatives were given. Patient is aware and agrees to proceed. All the questions were answered. This will be scheduled in upcoming days. Consent forms were given for review.  HPI:    Emily Huseris a 81y.o. female   For follow-up visit. Having dysphagia.  Not as bad as before.  Mostly with solids. Has been compliant with Protonix twice daily.  Not willing to reduce the dose as she does not have any further heartburn. Would like to get dilation done again  Denies having any diarrhea or constipation  Accompanied by her daughter who takes good care of her.  No fever chills or night sweats.  No weight loss.  Had blood tests done as a part of routine physical in December 2022 per patient's daughter with Dr. SShela Commonswere negative per daughter.    Past GI procedures/history:  EGD 12/23/2019 - Benign-appearing esophageal stenosis with esophageal diverticula. Dilated x 18 mm balloon. - Large hiatal hernia.  Dx with left-sided ulcerative colitis by Dr. SVeneta Penton9/2000.  Has been treated with Apriso.  Most recent colonoscopy (see under procedures tab) 05/2018 with complete mucosal healing. Bx-Quiescent colitis  -EGD 12/2019 : 6 cm hiatal hernia, distal esophageal stricture with multiple esophageal  diverticula s/p dilatation to TTS 18 mm balloon, fundic gland polyps.   Past Medical History:  Diagnosis Date   Allergy    Asthma    C. 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 Distal esophageal diverticula. Presbyesophagus. Hiatal hernia. Gastric polyps (status post polypectomy x2)    Family History  Problem Relation Age of Onset   Colon cancer Mother        not definitive whether it was colon cancer   Ulcerative colitis Daughter    Colon polyps Neg Hx    Rectal cancer Neg Hx    Stomach cancer Neg Hx    Esophageal cancer Neg Hx  Social History   Tobacco Use   Smoking status: Former    Types: Cigarettes    Quit date: 1994    Years since quitting: 29.6   Smokeless tobacco: Never  Vaping Use   Vaping Use: Never used  Substance Use Topics   Alcohol use: Yes    Comment: occasionally   Drug use: Never    Current Outpatient Medications  Medication Sig Dispense Refill   aspirin EC 81 MG tablet Take by mouth.     cloNIDine (CATAPRES) 0.1 MG tablet Take 0.1 mg by mouth as needed.     cloNIDine (CATAPRES) 0.3 MG tablet Take 0.3 mg by mouth at bedtime.     DULoxetine (CYMBALTA) 30 MG capsule Take 30 mg by  mouth daily.     levocetirizine (XYZAL) 5 MG tablet Take 5 mg by mouth daily.  3   mesalamine (APRISO) 0.375 g 24 hr capsule Take 4 capsules (1.5 g total) by mouth daily. 360 capsule 4   pantoprazole (PROTONIX) 40 MG tablet Take 1 tablet (40 mg total) by mouth 2 (two) times daily. 180 tablet 4   rosuvastatin (CRESTOR) 10 MG tablet Take 10 mg by mouth at bedtime.     venlafaxine (EFFEXOR) 75 MG tablet Take 225 mg by mouth daily.     Current Facility-Administered Medications  Medication Dose Route Frequency Provider Last Rate Last Admin   0.9 %  sodium chloride infusion  500 mL Intravenous Once Jackquline Denmark, MD        No Known Allergies  Review of Systems:  neg     Physical Exam:    BP (!) 118/52   Pulse 60   Temp (!) 97.5 F (36.4 C)   Ht 5' 3"  (1.6 m)   Wt 212 lb (96.2 kg)   SpO2 94%   BMI 37.55 kg/m  Wt Readings from Last 3 Encounters:  04/24/22 212 lb (96.2 kg)  03/07/22 212 lb 2 oz (96.2 kg)  12/07/21 208 lb 4 oz (94.5 kg)   Constitutional:  Well-developed, in no acute distress. Psychiatric: Normal mood and affect. Behavior is normal. Abdominal: Soft, nondistended. Nontender. Bowel sounds active throughout. There are no masses palpable. No hepatomegaly. Neurological: Alert and oriented to person place and time. Skin: Skin is warm and dry. No rashes noted.    Carmell Austria, MD 04/24/2022, 10:01 AM  Cc: Street, Sharon Mt, *

## 2022-04-24 NOTE — Progress Notes (Signed)
Vital signs checked by:DT  The medical and surgical history was reviewed and verified with the patient.

## 2022-04-24 NOTE — Patient Instructions (Addendum)
Thank you for letting us take care of your healthcare needs today. Please see handouts given to you on Dilation Diet and Hiatal Hernia.    YOU HAD AN ENDOSCOPIC PROCEDURE TODAY AT [facility]:   Refer to the procedure report that was given to you for any specific questions about what was found during the examination.  If the procedure report does not answer your questions, please call your gastroenterologist to clarify.  If you requested that your care partner not be given the details of your procedure findings, then the procedure report has been included in a sealed envelope for you to review at your convenience later.  YOU SHOULD EXPECT: Some feelings of bloating in the abdomen. Passage of more gas than usual.  Walking can help get rid of the air that was put into your GI tract during the procedure and reduce the bloating. If you had a lower endoscopy (such as a colonoscopy or flexible sigmoidoscopy) you may notice spotting of blood in your stool or on the toilet paper. If you underwent a bowel prep for your procedure, you may not have a normal bowel movement for a few days.  Please Note:  You might notice some irritation and congestion in your nose or some drainage.  This is from the oxygen used during your procedure.  There is no need for concern and it should clear up in a day or so.  SYMPTOMS TO REPORT IMMEDIATELY:    Following upper endoscopy (EGD)  Vomiting of blood or coffee ground material  New chest pain or pain under the shoulder blades  Painful or persistently difficult swallowing  New shortness of breath  Fever of 100F or higher  Black, tarry-looking stools  For urgent or emergent issues, a gastroenterologist can be reached at any hour by calling [phone]. Do not use MyChart messaging for urgent concerns.    DIET:  Clear liquids to start at 11:30 am until 12:30 pm then Soft diet for the rest of the day. Tomorrow you may proceed to your regular diet.   Drink plenty of fluids but you should avoid alcoholic beverages for 24 hours.  ACTIVITY:  You should plan to take it easy for the rest of today and you should NOT DRIVE or use heavy machinery until tomorrow (because of the sedation medicines used during the test).    FOLLOW UP: Our staff will call the number listed on your records the next business day following your procedure.  We will call around 7:15- 8:00 am to check on you and address any questions or concerns that you may have regarding the information given to you following your procedure. If we do not reach you, we will leave a message.  If you develop any symptoms (ie: fever, flu-like symptoms, shortness of breath, cough etc.) before then, please call [phone].  If you test positive for Covid 19 in the 2 weeks post procedure, please call and report this information to [place].    If any biopsies were taken you will be contacted by phone or by letter within the next 1-3 weeks.  Please call us at [phone] if you have not heard about the biopsies in 3 weeks.    SIGNATURES/CONFIDENTIALITY: You and/or your care partner have signed paperwork which will be entered into your electronic medical record.  These signatures attest to the fact that that the information above on your After Visit Summary has been reviewed and is understood.  Full responsibility of the confidentiality  of this discharge information lies with you and/or your care-partner.

## 2022-04-24 NOTE — Op Note (Signed)
[place] Patient Name: Emily Hancock Procedure Date: 04/24/2022 10:06 AM MRN: [id number] Endoscopist: Jackquline Denmark , MD Age: 81 Referring MD:  Date of Birth: 1941/01/05 Gender: Female Account #: [id number] Procedure:                Upper GI endoscopy Indications:              Dysphagia Medicines:                Monitored Anesthesia Care Procedure:                Pre-Anesthesia Assessment:                           - Prior to the procedure, a History and Physical                            was performed, and patient medications and                            allergies were reviewed. The patient's tolerance of                            previous anesthesia was also reviewed. The risks                            and benefits of the procedure and the sedation                            options and risks were discussed with the patient.                            All questions were answered, and informed consent                            was obtained. Prior Anticoagulants: The patient has                            taken no previous anticoagulant or antiplatelet                            agents. ASA Grade Assessment: III - A patient with                            severe systemic disease. After reviewing the risks                            and benefits, the patient was deemed in                            satisfactory condition to undergo the procedure.                           After obtaining informed consent, the endoscope was  passed under direct vision. Throughout the                            procedure, the patient's blood pressure, pulse, and                            oxygen saturations were monitored continuously. The                            Endoscope was introduced through the mouth, and                            advanced to the second part of duodenum. The upper                            GI endoscopy was accomplished without  difficulty.                            The patient tolerated the procedure well. Scope In: Scope Out: Findings:                 The lower third of the esophagus was significantly                            tortuous and foreshortened.                           One benign-appearing, intrinsic moderate                            (circumferential scarring or stenosis; an endoscope                            may pass) stenosis was found 32 cm from the                            incisors. This stenosis measured 1.2 cm (inner                            diameter). 4 diverticula measuring 6 mm to 1 cm or                            visualized just proximal to the stenosis. The                            stenosis was traversed. There was a slight "catch"                            on passage of the scope. A TTS dilator was passed                            through the scope. Dilation with a 16-17-18 mm  balloon dilator was performed to 18 mm.                           A 6 cm hiatal hernia was present. 10-15 polyps                            measuring 4 to 6 mm were visualized in the body of                            the stomach. Not biopsied since previously deemed                            to be hyperplastic.                           The exam was otherwise without abnormality. Complications:            No immediate complications. Estimated Blood Loss:     Estimated blood loss: none. 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 Dilated.                           - 6 cm hiatal hernia.                           - The examination was otherwise normal.                           - No specimens collected. Recommendation:           - Patient has a contact number available for                            emergencies. The signs and symptoms of potential                            delayed complications  were discussed with the                            patient. Return to normal activities tomorrow.                            Written discharge instructions were provided to the                            patient.                           - Post dilatation diet.                           - Continue present medications.                           -  If continued problems, would consider repeat EGD                            with Kenalog or Botox.                           - The findings and recommendations were discussed                            with the patient's family. Jackquline Denmark, MD 04/24/2022 10:35:05 AM This report has been signed electronically.

## 2022-04-24 NOTE — Progress Notes (Signed)
Called to room to assist during endoscopic procedure.  Patient ID and intended procedure confirmed with present staff. Received instructions for my participation in the procedure from the performing physician.  

## 2022-04-25 ENCOUNTER — Telehealth: Payer: Self-pay

## 2022-04-25 NOTE — Telephone Encounter (Signed)
Follow up call to pt, lm for pt to call if having any difficulty with normal activities or eating and drinking.  Also to call if any other questions or concerns.

## 2022-06-22 DIAGNOSIS — M1991 Primary osteoarthritis, unspecified site: Secondary | ICD-10-CM | POA: Diagnosis not present

## 2022-06-22 DIAGNOSIS — I10 Essential (primary) hypertension: Secondary | ICD-10-CM | POA: Diagnosis not present

## 2022-06-22 DIAGNOSIS — M7552 Bursitis of left shoulder: Secondary | ICD-10-CM | POA: Diagnosis not present

## 2022-06-22 DIAGNOSIS — K2960 Other gastritis without bleeding: Secondary | ICD-10-CM | POA: Diagnosis not present

## 2022-06-22 DIAGNOSIS — Z23 Encounter for immunization: Secondary | ICD-10-CM | POA: Diagnosis not present

## 2022-09-21 DIAGNOSIS — Z0000 Encounter for general adult medical examination without abnormal findings: Secondary | ICD-10-CM | POA: Diagnosis not present

## 2022-09-21 DIAGNOSIS — K5190 Ulcerative colitis, unspecified, without complications: Secondary | ICD-10-CM | POA: Diagnosis not present

## 2022-09-21 DIAGNOSIS — E785 Hyperlipidemia, unspecified: Secondary | ICD-10-CM | POA: Diagnosis not present

## 2022-09-21 DIAGNOSIS — Z79899 Other long term (current) drug therapy: Secondary | ICD-10-CM | POA: Diagnosis not present

## 2022-09-21 DIAGNOSIS — I10 Essential (primary) hypertension: Secondary | ICD-10-CM | POA: Diagnosis not present

## 2022-09-21 DIAGNOSIS — K760 Fatty (change of) liver, not elsewhere classified: Secondary | ICD-10-CM | POA: Diagnosis not present

## 2022-09-21 DIAGNOSIS — J301 Allergic rhinitis due to pollen: Secondary | ICD-10-CM | POA: Diagnosis not present

## 2022-09-21 DIAGNOSIS — J45909 Unspecified asthma, uncomplicated: Secondary | ICD-10-CM | POA: Diagnosis not present

## 2022-09-21 DIAGNOSIS — R7302 Impaired glucose tolerance (oral): Secondary | ICD-10-CM | POA: Diagnosis not present

## 2022-09-21 DIAGNOSIS — M1991 Primary osteoarthritis, unspecified site: Secondary | ICD-10-CM | POA: Diagnosis not present

## 2022-09-21 DIAGNOSIS — J3089 Other allergic rhinitis: Secondary | ICD-10-CM | POA: Diagnosis not present

## 2022-10-18 ENCOUNTER — Encounter: Payer: Self-pay | Admitting: Podiatry

## 2022-10-18 ENCOUNTER — Ambulatory Visit: Payer: Medicare Other | Admitting: Podiatry

## 2022-10-18 DIAGNOSIS — L603 Nail dystrophy: Secondary | ICD-10-CM | POA: Diagnosis not present

## 2022-10-18 NOTE — Addendum Note (Signed)
Addended by: Ammie Ferrier on: 10/18/2022 02:52 PM   Modules accepted: Orders

## 2022-10-18 NOTE — Progress Notes (Signed)
  Subjective:  Patient ID: Emily Hancock, female    DOB: Jan 10, 1941,   MRN: [id number]  Chief Complaint  Patient presents with   Nail Problem    Discoloration of great toe nails-has tried OTC medications.     82 y.o. female presents for concern of discoloration of the right great toe. Relates she has tried OTC medications with limited improvement . Denies any other pedal complaints. Denies n/v/f/c.   Past Medical History:  Diagnosis Date   Allergy    Asthma    C. difficile colitis    Cataract    bilateral   Chronic headache    COPD (chronic obstructive pulmonary disease) (HCC)    Depression    Family history of colon cancer    Mother-died at 41. 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 CFT <3 seconds. Normal hair growth on digits. No edema.  Skin. No lacerations or abrasions bilateral feet. Left hallux nail with some discoloration noted to distal laterral nail plat subungual debris and thickening noted as well.  Musculoskeletal: MMT 5/5 bilateral lower extremities in DF, PF, Inversion and Eversion. Deceased ROM in DF of ankle joint.  Neurological: Sensation intact to light touch.   Assessment:   1. Onychodystrophy      Plan:  Patient was evaluated and treated and all questions answered. -Examined patient -Discussed treatment options for painful dystrophic nails  -Clinical picture and Fungal culture was obtained by removing a portion of the hard nail itself from each of the involved toenails using a sterile nail nipper and sent to [facility]. Patient tolerated the biopsy procedure well without discomfort or need for anesthesia.  -Discussed fungal nail treatment options  including oral, topical, and laser treatments.  -Patient to return in 4 weeks for follow up evaluation and discussion of fungal culture results or sooner if symptoms worsen.   Lorenda Peck, DPM

## 2022-11-13 DIAGNOSIS — M7552 Bursitis of left shoulder: Secondary | ICD-10-CM | POA: Diagnosis not present

## 2022-11-13 DIAGNOSIS — M1991 Primary osteoarthritis, unspecified site: Secondary | ICD-10-CM | POA: Diagnosis not present

## 2022-11-13 DIAGNOSIS — I10 Essential (primary) hypertension: Secondary | ICD-10-CM | POA: Diagnosis not present

## 2022-11-22 ENCOUNTER — Encounter: Payer: Self-pay | Admitting: Podiatry

## 2022-11-22 ENCOUNTER — Ambulatory Visit: Payer: Medicare Other | Admitting: Podiatry

## 2022-11-22 DIAGNOSIS — L603 Nail dystrophy: Secondary | ICD-10-CM | POA: Diagnosis not present

## 2022-11-22 NOTE — Progress Notes (Signed)
  Subjective:  Patient ID: Emily Hancock, female    DOB: 1941/03/19,   MRN: [id number]  Chief Complaint  Patient presents with   Follow-up    Nail fungus. Discuss nail biopsy results.     82 y.o. female presents for concern of discoloration of the right great toe. Relates she has tried OTC medications with limited improvement . Denies any other pedal complaints. Denies n/v/f/c.   Past Medical History:  Diagnosis Date   Allergy    Asthma    C. difficile colitis    Cataract    bilateral   Chronic headache    COPD (chronic obstructive pulmonary disease) (HCC)    Depression    Family history of colon cancer    Mother-died at 58. 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 CFT <3 seconds. Normal hair growth on digits. No edema.  Skin. No lacerations or abrasions bilateral feet. Left hallux nail with some discoloration noted to distal laterral nail plat subungual debris and thickening noted as well.  Musculoskeletal: MMT 5/5 bilateral lower extremities in DF, PF, Inversion and Eversion. Deceased ROM in DF of ankle joint.  Neurological: Sensation intact to light touch.   Assessment:   1. Onychodystrophy       Plan:  Patient was evaluated and treated and all questions answered. -Examined patient -Discussed treatment options for painful dystrophic nails  -Cultures negative for any fungus.  -Discussed urea nail gel  -Patient to return as needed   Lorenda Peck, DPM

## 2023-01-02 DIAGNOSIS — F32A Depression, unspecified: Secondary | ICD-10-CM | POA: Diagnosis not present

## 2023-01-02 DIAGNOSIS — K589 Irritable bowel syndrome without diarrhea: Secondary | ICD-10-CM | POA: Diagnosis not present

## 2023-01-02 DIAGNOSIS — I6523 Occlusion and stenosis of bilateral carotid arteries: Secondary | ICD-10-CM | POA: Diagnosis not present

## 2023-01-02 DIAGNOSIS — Z7902 Long term (current) use of antithrombotics/antiplatelets: Secondary | ICD-10-CM | POA: Diagnosis not present

## 2023-01-02 DIAGNOSIS — G43909 Migraine, unspecified, not intractable, without status migrainosus: Secondary | ICD-10-CM | POA: Diagnosis not present

## 2023-01-02 DIAGNOSIS — Z7982 Long term (current) use of aspirin: Secondary | ICD-10-CM | POA: Diagnosis not present

## 2023-01-02 DIAGNOSIS — I10 Essential (primary) hypertension: Secondary | ICD-10-CM | POA: Diagnosis not present

## 2023-01-02 DIAGNOSIS — Z87891 Personal history of nicotine dependence: Secondary | ICD-10-CM | POA: Diagnosis not present

## 2023-01-02 DIAGNOSIS — M1990 Unspecified osteoarthritis, unspecified site: Secondary | ICD-10-CM | POA: Diagnosis not present

## 2023-01-02 DIAGNOSIS — R29706 NIHSS score 6: Secondary | ICD-10-CM | POA: Diagnosis not present

## 2023-01-02 DIAGNOSIS — K219 Gastro-esophageal reflux disease without esophagitis: Secondary | ICD-10-CM | POA: Diagnosis not present

## 2023-01-02 DIAGNOSIS — I639 Cerebral infarction, unspecified: Secondary | ICD-10-CM | POA: Diagnosis not present

## 2023-01-02 DIAGNOSIS — I491 Atrial premature depolarization: Secondary | ICD-10-CM | POA: Diagnosis not present

## 2023-01-02 DIAGNOSIS — I700 Atherosclerosis of aorta: Secondary | ICD-10-CM | POA: Diagnosis not present

## 2023-01-02 DIAGNOSIS — R778 Other specified abnormalities of plasma proteins: Secondary | ICD-10-CM | POA: Diagnosis not present

## 2023-01-02 DIAGNOSIS — J9811 Atelectasis: Secondary | ICD-10-CM | POA: Diagnosis not present

## 2023-01-02 DIAGNOSIS — Z79899 Other long term (current) drug therapy: Secondary | ICD-10-CM | POA: Diagnosis not present

## 2023-01-02 DIAGNOSIS — R519 Headache, unspecified: Secondary | ICD-10-CM | POA: Diagnosis not present

## 2023-01-02 DIAGNOSIS — J449 Chronic obstructive pulmonary disease, unspecified: Secondary | ICD-10-CM | POA: Diagnosis not present

## 2023-01-02 DIAGNOSIS — I6389 Other cerebral infarction: Secondary | ICD-10-CM | POA: Diagnosis not present

## 2023-01-02 DIAGNOSIS — E785 Hyperlipidemia, unspecified: Secondary | ICD-10-CM | POA: Diagnosis not present

## 2023-01-02 DIAGNOSIS — J439 Emphysema, unspecified: Secondary | ICD-10-CM | POA: Diagnosis not present

## 2023-01-02 DIAGNOSIS — R410 Disorientation, unspecified: Secondary | ICD-10-CM | POA: Diagnosis not present

## 2023-01-03 DIAGNOSIS — I6389 Other cerebral infarction: Secondary | ICD-10-CM | POA: Diagnosis not present

## 2023-01-10 DIAGNOSIS — I498 Other specified cardiac arrhythmias: Secondary | ICD-10-CM | POA: Diagnosis not present

## 2023-01-10 DIAGNOSIS — I10 Essential (primary) hypertension: Secondary | ICD-10-CM | POA: Diagnosis not present

## 2023-01-10 DIAGNOSIS — I679 Cerebrovascular disease, unspecified: Secondary | ICD-10-CM | POA: Diagnosis not present

## 2023-01-10 DIAGNOSIS — I672 Cerebral atherosclerosis: Secondary | ICD-10-CM | POA: Diagnosis not present

## 2023-01-10 DIAGNOSIS — K2960 Other gastritis without bleeding: Secondary | ICD-10-CM | POA: Diagnosis not present

## 2023-01-10 DIAGNOSIS — M1991 Primary osteoarthritis, unspecified site: Secondary | ICD-10-CM | POA: Diagnosis not present

## 2023-01-10 DIAGNOSIS — G8194 Hemiplegia, unspecified affecting left nondominant side: Secondary | ICD-10-CM | POA: Diagnosis not present

## 2023-01-11 DIAGNOSIS — I498 Other specified cardiac arrhythmias: Secondary | ICD-10-CM | POA: Diagnosis not present

## 2023-01-24 DIAGNOSIS — R531 Weakness: Secondary | ICD-10-CM | POA: Diagnosis not present

## 2023-01-24 DIAGNOSIS — R2689 Other abnormalities of gait and mobility: Secondary | ICD-10-CM | POA: Diagnosis not present

## 2023-01-29 DIAGNOSIS — R2689 Other abnormalities of gait and mobility: Secondary | ICD-10-CM | POA: Diagnosis not present

## 2023-01-29 DIAGNOSIS — R531 Weakness: Secondary | ICD-10-CM | POA: Diagnosis not present

## 2023-01-30 DIAGNOSIS — I498 Other specified cardiac arrhythmias: Secondary | ICD-10-CM | POA: Diagnosis not present

## 2023-01-31 DIAGNOSIS — R531 Weakness: Secondary | ICD-10-CM | POA: Diagnosis not present

## 2023-01-31 DIAGNOSIS — R2689 Other abnormalities of gait and mobility: Secondary | ICD-10-CM | POA: Diagnosis not present

## 2023-02-06 DIAGNOSIS — R2689 Other abnormalities of gait and mobility: Secondary | ICD-10-CM | POA: Diagnosis not present

## 2023-02-06 DIAGNOSIS — R531 Weakness: Secondary | ICD-10-CM | POA: Diagnosis not present

## 2023-02-07 ENCOUNTER — Encounter: Payer: Self-pay | Admitting: Cardiology

## 2023-02-07 ENCOUNTER — Encounter: Payer: Self-pay | Admitting: *Deleted

## 2023-02-08 DIAGNOSIS — R531 Weakness: Secondary | ICD-10-CM | POA: Diagnosis not present

## 2023-02-08 DIAGNOSIS — R2689 Other abnormalities of gait and mobility: Secondary | ICD-10-CM | POA: Diagnosis not present

## 2023-02-09 ENCOUNTER — Telehealth: Payer: Self-pay | Admitting: Gastroenterology

## 2023-02-09 NOTE — Telephone Encounter (Signed)
I have spoken to Toniann Fail, patient's daughter (on ROI) to advise that Dr Chales Abrahams prescribed the pantoprazole for patient's reflux and repeated esophageal strictures. Toniann Fail verbalizes understanding and denies any additional questions.

## 2023-02-09 NOTE — Telephone Encounter (Signed)
Unfortunately, Emily Hancock is not a designated party on any release of information forms that we have. We will be unable to provide her with any information regarding her mothers care.

## 2023-02-09 NOTE — Telephone Encounter (Signed)
Patient's daughter Judeth Cornfield is calling having questions regarding Pantoprazole medication, not sure who initially started patient on Pantoprazole and why. Please advise

## 2023-02-09 NOTE — Telephone Encounter (Signed)
Patient's daughter Emily Hancock called. Advised her she is not on the designated party release form. States it is okay to call daughter Emily Hancock who is on the form at [phone]. Please advise, thank you.

## 2023-02-12 DIAGNOSIS — J432 Centrilobular emphysema: Secondary | ICD-10-CM | POA: Diagnosis not present

## 2023-02-12 DIAGNOSIS — I700 Atherosclerosis of aorta: Secondary | ICD-10-CM | POA: Diagnosis not present

## 2023-02-12 DIAGNOSIS — J301 Allergic rhinitis due to pollen: Secondary | ICD-10-CM | POA: Diagnosis not present

## 2023-02-12 DIAGNOSIS — J3089 Other allergic rhinitis: Secondary | ICD-10-CM | POA: Diagnosis not present

## 2023-02-12 DIAGNOSIS — R2689 Other abnormalities of gait and mobility: Secondary | ICD-10-CM | POA: Diagnosis not present

## 2023-02-12 DIAGNOSIS — I498 Other specified cardiac arrhythmias: Secondary | ICD-10-CM | POA: Diagnosis not present

## 2023-02-12 DIAGNOSIS — R531 Weakness: Secondary | ICD-10-CM | POA: Diagnosis not present

## 2023-02-12 DIAGNOSIS — K2960 Other gastritis without bleeding: Secondary | ICD-10-CM | POA: Diagnosis not present

## 2023-02-15 DIAGNOSIS — R2689 Other abnormalities of gait and mobility: Secondary | ICD-10-CM | POA: Diagnosis not present

## 2023-02-15 DIAGNOSIS — R531 Weakness: Secondary | ICD-10-CM | POA: Diagnosis not present

## 2023-02-19 DIAGNOSIS — R2689 Other abnormalities of gait and mobility: Secondary | ICD-10-CM | POA: Diagnosis not present

## 2023-02-19 DIAGNOSIS — R531 Weakness: Secondary | ICD-10-CM | POA: Diagnosis not present

## 2023-02-26 DIAGNOSIS — R2689 Other abnormalities of gait and mobility: Secondary | ICD-10-CM | POA: Diagnosis not present

## 2023-02-26 DIAGNOSIS — R531 Weakness: Secondary | ICD-10-CM | POA: Diagnosis not present

## 2023-02-28 ENCOUNTER — Encounter: Payer: Self-pay | Admitting: Neurology

## 2023-02-28 ENCOUNTER — Ambulatory Visit (INDEPENDENT_AMBULATORY_CARE_PROVIDER_SITE_OTHER): Payer: Medicare Other | Admitting: Neurology

## 2023-02-28 VITALS — BP 132/72 | HR 72 | Ht 63.0 in | Wt 204.0 lb

## 2023-02-28 DIAGNOSIS — I6340 Cerebral infarction due to embolism of unspecified cerebral artery: Secondary | ICD-10-CM | POA: Diagnosis not present

## 2023-02-28 NOTE — Progress Notes (Signed)
[facility] Clinic Note - Initial Visit   Date: 02/28/2023   Emily Hancock MRN: [id number] DOB: 1940/10/05   Dear Dr. Casper Harrison:  Thank you for your kind referral of Delsy Mccoury for consultation of stroke. Although her history is well known to you, please allow [place] to reiterate it for the purpose of our medical record. The patient was accompanied to the clinic by daughters Toniann Fail and [place]) who also provides collateral information.     Emily Hancock is a 83 y.o. right-handed female with GERD, hyperlipidemia, hypertension, inflammatory bowel disease, migraines, depression and OA presenting for evaluation of stroke.   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  MRI reports suggestive multifocal stroke involving the left corona radiata/thalamocapsular region and dorsal pons.  There was no LVO.  No diabetes, tobacco use.  BP is well-controlled.    - Continue secondary risk factor modification  - Continue plavix 75mg  daily and crestor 20mg  daily  - She is scheduled to see cardiology next month to review cardiac monitor. If normal, loop recorder may be helpful   Return to clinic in 6 months  ------------------------------------------------------------- History of present illness: She was admitted to [facility] 4/23 - 4/25 for altered mental status where imaging was suggestive of acute embolic appearing stroke (MRI was motion degraded).  Family found her at home sitting on couch, confused, and not making sense.  CTA did not show large vessel occlusion. There was no atrial fibrillation on telemetry.  She was recommended to take plavix 5mg  and aspirin for 3 weeks, then plavix 75 daily.  Crestor was increased to 20mg  daily.  Family reports that word-finding difficulty resolved by the following day.  She has been doing well at home and had no further speech changes, weakness, or  numbness/tingling.  She had cardiac monitor and is scheduled to see cardiology next month.   Out-side paper records, electronic medical record, and images have been reviewed where available and summarized as:  MRI brain 01/03/2023:  Prematurely terminated and significantly motion degraded examination.  Apparent subcentimeter foci of signal abnormality within the left corona radiata/thalamocapsular junction and within the dorsal right aspect of the pons on the motion degraded axial diffusion-weighted sequence.  These foci may reflect acute infarct or artifact TTE 01/03/2023:  EF 55-60%, impaired diastolic relaxation, no AFO. CTA head and neck 01/03/2023: Patent vasculature of the head and neck with mild atherosclerotid disease at the carotid bifurcation and intracranial ICAs but no hemodyanamically significant stenosis of occlusion.   Past Medical History:  Diagnosis Date   Allergy    Asthma    Atypical squamous cells of undetermined significance (ASCUS) on Papanicolaou smear of cervix    C. difficile colitis    Cataract    bilateral   Chronic headache    COPD (chronic obstructive pulmonary disease) (HCC)    Depression    Dyslipidemia    Environmental and seasonal allergies    Family history of colon cancer    Mother-died at 82. 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 Distal esophageal diverticula. Presbyesophagus. Hiatal hernia. Gastric polyps (status post polypectomy x2)     Medications:  Outpatient Encounter Medications as of 02/28/2023  Medication Sig   amoxicillin (AMOXIL) 500 MG capsule Take 2,000 mg by mouth as directed. Take 1 hour prior to dental visit   cloNIDine (CATAPRES) 0.1 MG tablet Take 1-2 tablets by mouth every morning.   cloNIDine (CATAPRES) 0.3 MG tablet Take 0.3 mg by mouth at bedtime.   clopidogrel (PLAVIX) 75 MG tablet Take 75 mg by mouth daily.   DULoxetine (CYMBALTA) 30 MG capsule Take 30 mg by mouth daily.   levocetirizine (XYZAL) 5 MG tablet Take 5 mg by mouth daily.   mesalamine (APRISO) 0.375 g 24 hr capsule Take 4 capsules (1.5 g total) by mouth daily.   pantoprazole (PROTONIX) 40 MG tablet Take 40 mg by mouth every other day.   rosuvastatin (CRESTOR) 20 MG tablet Take 20 mg by mouth at bedtime.   torsemide (DEMADEX) 5 MG tablet Take 5 mg by mouth daily as needed (leg swelling).   venlafaxine (EFFEXOR) 75 MG tablet Take 3 tablets by mouth daily.   aspirin EC 81 MG tablet Take 81 mg by mouth daily. (Patient not taking: Reported on 02/28/2023)   potassium chloride SA (KLOR-CON M) 20 MEQ tablet Take 20 mEq by mouth daily. With Torsemide (Patient not taking: Reported on 02/28/2023)   No facility-administered encounter medications on file as of 02/28/2023.    Allergies:  Allergies  Allergen Reactions   Cefdinir Other (See Comments)    Nervous    Family History: Family History  Problem Relation Age of Onset   Colon cancer Mother        not definitive whether it was colon cancer   Lung cancer Father    Heart attack Father    Asthma Sister    Ulcerative colitis Daughter     Colon polyps Neg Hx    Rectal cancer Neg Hx    Stomach cancer Neg Hx    Esophageal cancer Neg Hx     Social History: Social History   Tobacco Use   Smoking status: Former    Types: Cigarettes    Quit date: 1994    Years since quitting: 30.4   Smokeless tobacco: Never  Vaping Use   Vaping Use: Never used  Substance Use Topics   Alcohol use: Yes    Comment: occasionally   Drug use: Never   Social History   Social History Narrative   Right Handed    Lives in a mobile home. Alone.      Daughters- Toniann Fail Hagerty & Woodroe Mode     Vital Signs:  BP (!) 156/75   Pulse 72   Ht 5\' 3"  (1.6 m)   Wt 204 lb (92.5 kg)   SpO2 94%   BMI 36.14 kg/m     Neurological Exam: MENTAL STATUS including orientation to time, place, person,  recent and remote memory, attention span and concentration, language, and fund of knowledge is fairly intact. Speech is not dysarthric. No expressive aphasia  CRANIAL NERVES: II:  No visual field defects.     III-IV-VI: Pupils equal round and reactive to light.  Normal conjugate, extra-ocular eye movements in all directions of gaze.  No nystagmus.  No ptosis.   V:  Normal facial sensation.    VII:  Normal facial symmetry and movements.   VIII:  Normal hearing and vestibular function.   IX-X:  Normal palatal movement.   XI:  Normal shoulder shrug and head rotation.   XII:  Normal tongue strength and range of motion, no deviation or fasciculation.  MOTOR:  No atrophy, fasciculations or abnormal movements.  No pronator drift.   Upper Extremity:  Right  Left  Deltoid  5/5   5/5   Biceps  5/5   5/5   Triceps  5/5   5/5   Wrist extensors  5/5   5/5   Wrist flexors  5/5   5/5   Finger extensors  5/5   5/5   Finger flexors  5/5   5/5   Dorsal interossei  5/5   5/5   Abductor pollicis  5/5   5/5   Tone (Ashworth scale)  0  0   Lower Extremity:  Right  Left  Hip flexors  5/5   5/5   Knee flexors  5/5   5/5   Knee extensors  5/5   5/5    Dorsiflexors  5/5   5/5   Plantarflexors  5/5   5/5   Toe extensors  5/5   5/5   Toe flexors  5/5   5/5   Tone (Ashworth scale)  0  0   MSRs:                                           Right        Left brachioradialis 2+  2+  biceps 2+  2+  triceps 2+  2+  patellar 2+  2+  ankle jerk 2+  2+  Hoffman no  no  plantar response down  down   SENSORY:  Normal and symmetric perception of light touch, pinprick, vibration, and temperature  COORDINATION/GAIT: Normal finger-to- nose-finger.  Intact rapid alternating movements bilaterally. Gait is assisted with cane, stable.   Total time spent reviewing records, interview, history/exam, documentation, and coordination of care on day of encounter:  45 min    Thank you for allowing me to participate in patient's care.  If I can answer any additional questions, I would be pleased to do so.    Sincerely,    Miaya Lafontant K. Allena Katz, DO

## 2023-02-28 NOTE — Patient Instructions (Signed)
Continue plavix 75mg  daily  Follow-up with cardiology next month  I will see you back in 6 months

## 2023-03-06 DIAGNOSIS — R7301 Impaired fasting glucose: Secondary | ICD-10-CM | POA: Diagnosis not present

## 2023-03-06 DIAGNOSIS — E785 Hyperlipidemia, unspecified: Secondary | ICD-10-CM | POA: Diagnosis not present

## 2023-03-14 ENCOUNTER — Other Ambulatory Visit: Payer: Self-pay | Admitting: Gastroenterology

## 2023-03-19 DIAGNOSIS — I700 Atherosclerosis of aorta: Secondary | ICD-10-CM | POA: Diagnosis not present

## 2023-03-19 DIAGNOSIS — N3281 Overactive bladder: Secondary | ICD-10-CM | POA: Diagnosis not present

## 2023-03-19 DIAGNOSIS — I10 Essential (primary) hypertension: Secondary | ICD-10-CM | POA: Diagnosis not present

## 2023-03-19 DIAGNOSIS — G8194 Hemiplegia, unspecified affecting left nondominant side: Secondary | ICD-10-CM | POA: Diagnosis not present

## 2023-03-19 DIAGNOSIS — K5190 Ulcerative colitis, unspecified, without complications: Secondary | ICD-10-CM | POA: Diagnosis not present

## 2023-03-19 DIAGNOSIS — I498 Other specified cardiac arrhythmias: Secondary | ICD-10-CM | POA: Diagnosis not present

## 2023-03-19 DIAGNOSIS — I679 Cerebrovascular disease, unspecified: Secondary | ICD-10-CM | POA: Diagnosis not present

## 2023-03-19 DIAGNOSIS — E785 Hyperlipidemia, unspecified: Secondary | ICD-10-CM | POA: Diagnosis not present

## 2023-03-19 DIAGNOSIS — J432 Centrilobular emphysema: Secondary | ICD-10-CM | POA: Diagnosis not present

## 2023-03-19 DIAGNOSIS — I672 Cerebral atherosclerosis: Secondary | ICD-10-CM | POA: Diagnosis not present

## 2023-03-26 ENCOUNTER — Ambulatory Visit: Payer: Medicare Other | Attending: Cardiology | Admitting: Cardiology

## 2023-03-26 ENCOUNTER — Encounter: Payer: Self-pay | Admitting: Cardiology

## 2023-03-26 VITALS — BP 134/72 | HR 74 | Ht 63.0 in | Wt 198.8 lb

## 2023-03-26 DIAGNOSIS — E785 Hyperlipidemia, unspecified: Secondary | ICD-10-CM | POA: Diagnosis not present

## 2023-03-26 DIAGNOSIS — I6930 Unspecified sequelae of cerebral infarction: Secondary | ICD-10-CM | POA: Diagnosis not present

## 2023-03-26 DIAGNOSIS — I498 Other specified cardiac arrhythmias: Secondary | ICD-10-CM | POA: Diagnosis not present

## 2023-03-26 DIAGNOSIS — I10 Essential (primary) hypertension: Secondary | ICD-10-CM

## 2023-03-26 NOTE — Patient Instructions (Signed)
Medication Instructions:  Your physician recommends that you continue on your current medications as directed. Please refer to the Current Medication list given to you today.  *If you need a refill on your cardiac medications before your next appointment, please call your pharmacy*   Lab Work: None Ordered If you have labs (blood work) drawn today and your tests are completely normal, you will receive your results only by: MyChart Message (if you have MyChart) OR A paper copy in the mail If you have any lab test that is abnormal or we need to change your treatment, we will call you to review the results.   Testing/Procedures: None Ordered   Follow-Up: At [facility], you and your health needs are our priority.  As part of our continuing mission to provide you with exceptional heart care, we have created designated Provider Care Teams.  These Care Teams include your primary Cardiologist (physician) and Advanced Practice Providers (APPs -  Physician Assistants and Nurse Practitioners) who all work together to provide you with the care you need, when you need it.  We recommend signing up for the patient portal called "MyChart".  Sign up information is provided on this After Visit Summary.  MyChart is used to connect with patients for Virtual Visits (Telemedicine).  Patients are able to view lab/test results, encounter notes, upcoming appointments, etc.  Non-urgent messages can be sent to your provider as well.   To learn more about what you can do with MyChart, go to [email or web address].    Your next appointment:   5 month(s)  The format for your next appointment:   In Person  Provider:   Gypsy Balsam, MD    Other Instructions Referral to Dr. Elberta Fortis- They will call for appt

## 2023-03-26 NOTE — Addendum Note (Signed)
Addended by: Baldo Ash D on: 03/26/2023 10:36 AM   Modules accepted: Orders

## 2023-03-26 NOTE — Progress Notes (Signed)
Cardiology Consultation:    Date:  03/26/2023   ID:  Emily Hancock, DOB 06-Jan-1941, MRN [id number]  PCP:  Street, Stephanie Coup, MD  Cardiologist:  Gypsy Balsam, MD   Referring MD: Casper Harrison Stephanie Coup, *   Chief Complaint  Patient presents with   S/p stroke     01/02/2023.      History of Present Illness:    Emily Hancock is a 82 y.o. female who is being seen today for the evaluation of history of CVA at the request of Street, Stephanie Coup, *.  Past medical history significant for essential hypertension, dyslipidemia.  In April she presented to the hospital with aphasia she was find to have multiple what appears to be embolic strokes.  Workup in the hospital has been negative, echocardiogram showed preserved left ventricle ejection fraction, and no evidence of intracardiac shunt, CT angio of her neck did not show any significant obstructive disease.  She did wear monitor to look for episodes of atrial fibrillation however that appears to be negative, I still do not have report of it we will wait for official report.  She comes today to my office to be established as a patient.  She said she is doing quite well after her stroke she recovered almost completely her speech is back to almost normal.  She comes to my office with her daughter as well as her son-in-law.  Denies have any chest pain tightness squeezing pressure burning chest.  Recently travel to [place] and there was long trip 9 hours did quite well.  Was walking around then.  No Visit tenderness of CVA.  Denies similar palpitations, never had any heart trouble.  There is no family history of premature coronary artery disease.  Past Medical History:  Diagnosis Date   Allergy    Asthma    Atypical squamous cells of undetermined significance (ASCUS) on Papanicolaou smear of cervix    C. difficile colitis    Cataract    bilateral   Chronic headache    COPD (chronic obstructive pulmonary disease) (HCC)    Depression     Dyslipidemia    Environmental and seasonal allergies    Family history of colon cancer    Mother-died at 48. 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 Distal esophageal diverticula. Presbyesophagus. Hiatal hernia. Gastric polyps (status post polypectomy x2)    Current Medications: Current Meds  Medication Sig   amoxicillin (AMOXIL) 500 MG capsule Take 2,000 mg by mouth  as directed. Take 1 hour prior to dental visit   cloNIDine (CATAPRES) 0.1 MG tablet Take 1-2 tablets by mouth every morning.   cloNIDine (CATAPRES) 0.3 MG tablet Take 0.3 mg by mouth at bedtime.   clopidogrel (PLAVIX) 75 MG tablet Take 75 mg by mouth daily.   Coenzyme Q10 (CO Q 10 PO) Take 1 tablet by mouth daily.   Incontinence Supply Disposable (BLADDER CONTROL PAD REGULAR) MISC 1 tablet by Does not apply route daily.   mesalamine (APRISO) 0.375 g 24 hr capsule TAKE 4 CAPSULES BY MOUTH DAILY.   Multiple Vitamin (MULTIVITAMIN ADULT PO) Take 1 tablet by mouth daily.   pantoprazole (PROTONIX) 40 MG tablet Take 40 mg by mouth every other day.    rosuvastatin (CRESTOR) 20 MG tablet Take 20 mg by mouth at bedtime.   venlafaxine (EFFEXOR) 75 MG tablet Take 3 tablets by mouth daily.   [DISCONTINUED] aspirin EC 81 MG tablet Take 81 mg by mouth daily.   [DISCONTINUED] DULoxetine (CYMBALTA) 30 MG capsule Take 30 mg by mouth daily.   [DISCONTINUED] levocetirizine (XYZAL) 5 MG tablet Take 5 mg by mouth daily.   [DISCONTINUED] potassium chloride SA (KLOR-CON M) 20 MEQ tablet Take 20 mEq by mouth daily. With Torsemide   [DISCONTINUED] torsemide (DEMADEX) 5 MG tablet Take 5 mg by mouth daily as needed (leg swelling).     Allergies:   Cefdinir   Social History   Socioeconomic History   Marital status: Divorced    Spouse name: Not on file   Number of children: Not on file   Years of education: Not on file   Highest education level: Not on file  Occupational History   Not on file  Tobacco Use   Smoking status: Former    Current packs/day: 0.00    Types: Cigarettes    Quit date: 23    Years since quitting: 30.5   Smokeless tobacco: Never  Vaping Use   Vaping status: Never Used  Substance and Sexual Activity   Alcohol use: Yes    Comment: occasionally   Drug use: Never   Sexual activity: Not on file  Other Topics Concern   Not on file  Social History Narrative   Right Handed    Lives in a mobile home. Alone.      Daughters- Toniann Fail Hagerty & Woodroe Mode    Social Determinants of Health   Financial Resource Strain: Not on file  Food Insecurity: Not on file  Transportation Needs: Not on file  Physical Activity: Not on file  Stress: Not on file  Social Connections: Not on file     Family History: The patient's family history includes Asthma in her sister; Colon cancer in her mother; Heart attack in her father; Lung cancer in her father; Ulcerative colitis in her daughter. There is no history of Colon polyps, Rectal cancer, Stomach cancer, or Esophageal cancer. ROS:   Please see the history of present illness.    All  14 point review of systems negative except as described per history of present illness.  EKGs/Labs/Other Studies Reviewed:    The following studies were reviewed today: I did review record from the hospital which included echocardiogram showing preserved left ventricle ejection fraction, MRI of the head, echocardiogram has been performed with injection of agitated saline that did not show any evidence of intracardiac shunt.  EKG:  EKG Interpretation Date/Time:  Monday March 26 2023 09:35:22 EDT Ventricular Rate:  74 PR Interval:  142 QRS Duration:  86 QT Interval:  396 QTC Calculation: 439 R Axis:   66  Text Interpretation: Normal sinus rhythm Normal ECG No previous ECGs available Confirmed by Gypsy Balsam [id number]) on 03/26/2023 9:45:26 AM    Recent Labs: No results found for requested labs within last 365 days.  Recent Lipid Panel No results found for: "CHOL", "TRIG", "HDL", "CHOLHDL", "VLDL", "LDLCALC", "LDLDIRECT"  Physical Exam:    VS:  BP 134/72 (BP Location: Left Arm, Patient Position: Sitting)   Pulse 74   Ht 5\' 3"  (1.6 m)   Wt 198 lb 12.8 oz (90.2 kg)   SpO2 93%   BMI 35.22 kg/m     Wt Readings from Last 3 Encounters:  03/26/23 198 lb 12.8 oz (90.2 kg)  02/28/23 204 lb (92.5 kg)  01/10/23 203 lb (92.1 kg)     GEN:  Well nourished, well developed in no acute distress HEENT: Normal NECK: No JVD; No carotid bruits LYMPHATICS: No lymphadenopathy CARDIAC: RRR, no murmurs, no rubs, no gallops RESPIRATORY:  Clear to auscultation without rales, wheezing or rhonchi  ABDOMEN: Soft, non-tender, non-distended MUSCULOSKELETAL:  No edema; No deformity  SKIN: Warm and dry NEUROLOGIC:  Alert and oriented x 3 PSYCHIATRIC:  Normal affect   ASSESSMENT:    1. Paroxysmal cardiac arrhythmia   2. Late effect of cerebrovascular accident (CVA)   3. Essential hypertension   4. Dyslipidemia    PLAN:    In order of problems listed above:  Late effect of CVA.  Obviously we  try to get up-to-date urology of this phenomenon.  Recent ischemic stroke probably embolic.  Will rule out atrial fibrillation.  She did wear monitor which was also for being unrevealing.  I had a conversation with patient as well as with the family about potential implantable loop recorder.  I am waiting for official report of her monitor if that is negative she will be referred to our EP team for consideration of implantable loop recorder.  In the meantime she is on antiplatelet therapy in form of Plavix which I will continue.  Also on Crestor 20 which I will continue. Dyslipidemia on Crestor 20 which is high and his statin I will continue for now.  Will get fasting lipid profile from primary care physician. Essential hypertension blood pressure seems to be reasonably controlled.  Will continue present management.   Medication Adjustments/Labs and Tests Ordered: Current medicines are reviewed at length with the patient today.  Concerns regarding medicines are outlined above.  Orders Placed This Encounter  Procedures   EKG 12-Lead   No orders of the defined types were placed in this encounter.   Signed, Georgeanna Lea, MD, [facility]. 03/26/2023 10:15 AM    [facility]

## 2023-05-21 ENCOUNTER — Institutional Professional Consult (permissible substitution): Payer: Medicare Other | Admitting: Cardiology

## 2023-05-22 DIAGNOSIS — J40 Bronchitis, not specified as acute or chronic: Secondary | ICD-10-CM | POA: Diagnosis not present

## 2023-06-08 DIAGNOSIS — J329 Chronic sinusitis, unspecified: Secondary | ICD-10-CM | POA: Diagnosis not present

## 2023-06-08 DIAGNOSIS — J40 Bronchitis, not specified as acute or chronic: Secondary | ICD-10-CM | POA: Diagnosis not present

## 2023-06-25 DIAGNOSIS — R058 Other specified cough: Secondary | ICD-10-CM | POA: Diagnosis not present

## 2023-06-25 DIAGNOSIS — R051 Acute cough: Secondary | ICD-10-CM | POA: Diagnosis not present

## 2023-07-10 ENCOUNTER — Telehealth: Payer: Self-pay | Admitting: Cardiology

## 2023-07-10 NOTE — Telephone Encounter (Signed)
Left message for patient to call back  

## 2023-07-10 NOTE — Telephone Encounter (Signed)
Pt's daughter would like to know if the appt on 11/5 would be when the pt gets the implant. Please advise

## 2023-07-11 NOTE — Telephone Encounter (Signed)
Pt daughter returning call 

## 2023-07-11 NOTE — Telephone Encounter (Signed)
Daughter calling back to say that she found an authorization number on the website. Please advise   Auth [id number]

## 2023-07-11 NOTE — Telephone Encounter (Signed)
Spoke to dtr, aware Berkley Harvey is pending for potential ILR implant day of her visit with Dr. Elberta Fortis next Tuesday. Informed that I would message our billing department for any updates on approval. Dtr verbalized understanding and agreeable to plan.

## 2023-07-12 NOTE — Telephone Encounter (Signed)
Spoke to dtr yesterday evening informing her that this was not an authorization number. Informed that our billing department is looking into this and Berkley Harvey is currently pending. Dtr thanked me for calling her back.

## 2023-07-17 ENCOUNTER — Ambulatory Visit: Payer: Medicare Other | Admitting: Cardiology

## 2023-07-17 NOTE — Telephone Encounter (Signed)
Called with Auth # [id number]. Please advise

## 2023-08-08 DIAGNOSIS — M25512 Pain in left shoulder: Secondary | ICD-10-CM | POA: Diagnosis not present

## 2023-08-08 DIAGNOSIS — Z23 Encounter for immunization: Secondary | ICD-10-CM | POA: Diagnosis not present

## 2023-08-23 DIAGNOSIS — R053 Chronic cough: Secondary | ICD-10-CM | POA: Diagnosis not present

## 2023-08-23 DIAGNOSIS — J45991 Cough variant asthma: Secondary | ICD-10-CM | POA: Diagnosis not present

## 2023-08-23 DIAGNOSIS — G4733 Obstructive sleep apnea (adult) (pediatric): Secondary | ICD-10-CM | POA: Diagnosis not present

## 2023-08-23 DIAGNOSIS — R400 Somnolence: Secondary | ICD-10-CM | POA: Diagnosis not present

## 2023-08-29 DIAGNOSIS — Z7982 Long term (current) use of aspirin: Secondary | ICD-10-CM | POA: Diagnosis not present

## 2023-08-29 DIAGNOSIS — Z7902 Long term (current) use of antithrombotics/antiplatelets: Secondary | ICD-10-CM | POA: Diagnosis not present

## 2023-08-29 DIAGNOSIS — R7989 Other specified abnormal findings of blood chemistry: Secondary | ICD-10-CM | POA: Diagnosis not present

## 2023-08-29 DIAGNOSIS — J449 Chronic obstructive pulmonary disease, unspecified: Secondary | ICD-10-CM | POA: Diagnosis not present

## 2023-08-29 DIAGNOSIS — G459 Transient cerebral ischemic attack, unspecified: Secondary | ICD-10-CM | POA: Diagnosis not present

## 2023-08-29 DIAGNOSIS — N3281 Overactive bladder: Secondary | ICD-10-CM | POA: Diagnosis not present

## 2023-08-29 DIAGNOSIS — Z79899 Other long term (current) drug therapy: Secondary | ICD-10-CM | POA: Diagnosis not present

## 2023-08-29 DIAGNOSIS — I493 Ventricular premature depolarization: Secondary | ICD-10-CM | POA: Diagnosis not present

## 2023-08-29 DIAGNOSIS — K219 Gastro-esophageal reflux disease without esophagitis: Secondary | ICD-10-CM | POA: Diagnosis not present

## 2023-08-29 DIAGNOSIS — K580 Irritable bowel syndrome with diarrhea: Secondary | ICD-10-CM | POA: Diagnosis not present

## 2023-08-29 DIAGNOSIS — R4789 Other speech disturbances: Secondary | ICD-10-CM | POA: Diagnosis not present

## 2023-08-29 DIAGNOSIS — Z8673 Personal history of transient ischemic attack (TIA), and cerebral infarction without residual deficits: Secondary | ICD-10-CM | POA: Diagnosis not present

## 2023-08-29 DIAGNOSIS — F985 Adult onset fluency disorder: Secondary | ICD-10-CM | POA: Diagnosis not present

## 2023-08-29 DIAGNOSIS — R29818 Other symptoms and signs involving the nervous system: Secondary | ICD-10-CM | POA: Diagnosis not present

## 2023-08-29 DIAGNOSIS — I10 Essential (primary) hypertension: Secondary | ICD-10-CM | POA: Diagnosis not present

## 2023-08-30 ENCOUNTER — Telehealth: Payer: Self-pay | Admitting: Cardiology

## 2023-08-30 DIAGNOSIS — F985 Adult onset fluency disorder: Secondary | ICD-10-CM | POA: Diagnosis not present

## 2023-08-30 DIAGNOSIS — R7989 Other specified abnormal findings of blood chemistry: Secondary | ICD-10-CM | POA: Diagnosis not present

## 2023-08-30 DIAGNOSIS — R4789 Other speech disturbances: Secondary | ICD-10-CM | POA: Diagnosis not present

## 2023-08-30 DIAGNOSIS — R479 Unspecified speech disturbances: Secondary | ICD-10-CM | POA: Diagnosis not present

## 2023-08-30 NOTE — Telephone Encounter (Signed)
Left detailed message informing dtr that we do have auth of file for 12/27 OV for possible ILR implant. Advised to call office with further questions.

## 2023-08-30 NOTE — Telephone Encounter (Signed)
Daughter Toniann Fail) called to confirm patient's authorization has been approved for patient's visit on 12/27.

## 2023-09-07 ENCOUNTER — Ambulatory Visit: Payer: Medicare Other | Attending: Cardiology | Admitting: Cardiology

## 2023-09-07 ENCOUNTER — Encounter: Payer: Self-pay | Admitting: Cardiology

## 2023-09-07 VITALS — BP 110/74 | HR 86 | Ht 63.0 in | Wt 199.4 lb

## 2023-09-07 DIAGNOSIS — I6930 Unspecified sequelae of cerebral infarction: Secondary | ICD-10-CM

## 2023-09-07 NOTE — Progress Notes (Signed)
Electrophysiology Office Note:   Date:  09/07/2023  ID:  Emily Hancock, DOB 18-Oct-1940, MRN [id number]  Primary Cardiologist: None Primary Heart Failure: None Electrophysiologist: None      History of Present Illness:   Emily Hancock is a 82 y.o. female with h/o CVA, hypertension, hyperlipidemia seen today for  for Electrophysiology evaluation of CVA at the request of Gypsy Balsam.    April 2024, she developed aphasia.  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  Workup thus far has shown no cause for her CVA.  She wore a cardiac monitor that showed no evidence of atrial fibrillation or flutter.  Her speech is almost back to normal.  She is back to her normal daily activities.  She has no restrictions at this time.  Review of systems complete and found to be negative unless listed in HPI.   EP Information / Studies Reviewed:    EKG is not ordered today. EKG from 03/26/23 reviewed which showed sinus rhythm  EKG Interpretation Date/Time:  Friday September 07 2023 14:09:55 EST Ventricular Rate:  86 PR Interval:  152 QRS Duration:  78 QT Interval:  370 QTC Calculation: 442 R Axis:   83  Text Interpretation: Normal sinus rhythm Normal ECG When compared with ECG of 26-Mar-2023 09:35, No significant change was found Confirmed by [employer], Nayzeth Altman ([id number]) on 09/07/2023 2:11:38 PM     Risk Assessment/Calculations:              Physical Exam:   VS:  BP 110/74 (BP Location: Right Arm, Patient Position: Sitting, Cuff Size: Normal)   Pulse 86   Ht 5\' 3"  (1.6 m)   Wt 199 lb 6.4 oz (90.4 kg)   SpO2 97%   BMI 35.32 kg/m    Wt Readings from Last 3 Encounters:  09/07/23 199 lb 6.4 oz (90.4 kg)  03/26/23 198 lb 12.8 oz (90.2 kg)  02/28/23 204 lb (92.5 kg)     GEN: Well nourished, well developed in no acute distress NECK: No JVD; No carotid bruits CARDIAC: Regular rate and rhythm, no murmurs, rubs, gallops RESPIRATORY:  Clear to auscultation without rales, wheezing or rhonchi   ABDOMEN: Soft, non-tender, non-distended EXTREMITIES:  No edema; No deformity   ASSESSMENT AND PLAN:    1.  Cryptogenic stroke: Has had improvement in her symptoms.  No causes been found.  She did wear a cardiac monitor without evidence of atrial fibrillation.  She would prefer long-term monitoring.  Due to that, we Emily Hancock plan for ILR implant.  Risk and benefits have been discussed.  Risks include bleeding and infection.  She understands the risks and is agreed to the procedure.  2.  Hypertension: Plan per primary care  Follow up with Dr. Elberta Hancock  pending ILR results   Signed, Emily Deyo Jorja Loa, MD   SURGEON:  Emily Arquette Jorja Loa, MD     PREPROCEDURE DIAGNOSIS:  Cryptogenic stroke    POSTPROCEDURE DIAGNOSIS: Cryptogenic stroke     PROCEDURES:   1. Implantable loop recorder implantation    INTRODUCTION:  Emily Hancock presents with a history of cryptogenic stroke The costs of loop recorder monitoring have been discussed with the patient.    DESCRIPTION OF PROCEDURE:  Informed written consent was obtained.   Time Out Completed with RN    The patient required no sedation for the procedure today.  Mapping over the patient's chest was performed to identify the area where electrograms were most prominent for ILR recording.  This area was found to  be the left parasternal region over the 4th intercostal space. The patients left chest was therefore prepped and draped in the usual sterile fashion. The skin overlying the left parasternal region was infiltrated with lidocaine for local analgesia.  A 0.5-cm incision was made over the left parasternal region over the 3rd intercostal space.  A subcutaneous ILR pocket was fashioned using a combination of sharp and blunt dissection.  A Medtronic Reveal LINQ 2 implantable loop recorder (serial # [id number]) was then placed into the pocket  R waves were very prominent and measuring  0.37 .  Steri- Strips and a sterile dressing were then applied.  There  were no early apparent complications.     CONCLUSIONS:   1. Successful implantation of a implantable loop recorder for a history of cryptogenic stroke  2. No early apparent complications.   Layth Cerezo Jorja Loa, MD  Cardiac Electrophysiology

## 2023-09-07 NOTE — Patient Instructions (Signed)
Medication Instructions:  Your physician recommends that you continue on your current medications as directed. Please refer to the Current Medication list given to you today.  Labwork: None ordered.  Testing/Procedures: None ordered.  Follow-Up:  Your physician wants you to follow-up as needed with Dr. Camnitz    Implantable Loop Recorder Placement, Care After This sheet gives you information about how to care for yourself after your procedure. Your health care provider may also give you more specific instructions. If you have problems or questions, contact your health care provider. What can I expect after the procedure? After the procedure, it is common to have: Soreness or discomfort near the incision. Some swelling or bruising near the incision.  Follow these instructions at home: Incision care  Monitor your cardiac device site for redness, swelling, and drainage. Call the device clinic at [phone] if you experience these symptoms or fever/chills.  Keep the large square bandage on your site for 24 hours and then you may remove it yourself. Keep the steri-strips underneath in place.   You may shower after 72 hours / 3 days from your procedure with the steri-strips in place. They will usually fall off on their own, or may be removed after 10 days. Pat dry.   Avoid lotions, ointments, or perfumes over your incision until it is well-healed.  Please do not submerge in water until your site is completely healed.   Your device is MRI compatible.   Remote monitoring is used to monitor your cardiac device from home. This monitoring is scheduled every month by our office. It allows us to keep an eye on the function of your device to ensure it is working properly.  If your wound site starts to bleed apply pressure.    For help with the monitor please call Medtronic Monitor Support Specialist directly at [phone].    If you have any questions/concerns please call the device  clinic at [phone].  Activity  Return to your normal activities.  General instructions Follow instructions from your health care provider about how to manage your implantable loop recorder and transmit the information. Learn how to activate a recording if this is necessary for your type of device. You may go through a metal detection gate, and you may let someone hold a metal detector over your chest. Show your ID card if needed. Do not have an MRI unless you check with your health care provider first. Take over-the-counter and prescription medicines only as told by your health care provider. Keep all follow-up visits as told by your health care provider. This is important. Contact a health care provider if: You have redness, swelling, or pain around your incision. You have a fever. You have pain that is not relieved by your pain medicine. You have triggered your device because of fainting (syncope) or because of a heartbeat that feels like it is racing, slow, fluttering, or skipping (palpitations). Get help right away if you have: Chest pain. Difficulty breathing. Summary After the procedure, it is common to have soreness or discomfort near the incision. Change your dressing as told by your health care provider. Follow instructions from your health care provider about how to manage your implantable loop recorder and transmit the information. Keep all follow-up visits as told by your health care provider. This is important. This information is not intended to replace advice given to you by your health care provider. Make sure you discuss any questions you have with your health care provider. Document Released: 08/09/2015   Document Revised: 10/13/2017 Document Reviewed: 10/13/2017 Elsevier Patient Education  2020 Elsevier Inc.  

## 2023-09-10 ENCOUNTER — Telehealth: Payer: Self-pay | Admitting: Neurology

## 2023-09-10 NOTE — Telephone Encounter (Signed)
Caller stated she needs records from [facility] for patients appointment. Per [facility] records have to be requested. Fax number is [phone] and Nurse at [facility] direct number is [phone]

## 2023-09-13 NOTE — Telephone Encounter (Signed)
 Called Emily Hancock and informed her that we are unable to request records from [facility] without a signed consent. Informed [name] that we can do a signed consent on 09/17/23 at patients follow up to request records then. [name] verbalized understanding and had no further questions or concerns.

## 2023-09-17 ENCOUNTER — Ambulatory Visit: Payer: Medicare Other | Admitting: Neurology

## 2023-09-20 DIAGNOSIS — K5190 Ulcerative colitis, unspecified, without complications: Secondary | ICD-10-CM | POA: Diagnosis not present

## 2023-09-20 DIAGNOSIS — G8194 Hemiplegia, unspecified affecting left nondominant side: Secondary | ICD-10-CM | POA: Diagnosis not present

## 2023-09-20 DIAGNOSIS — I672 Cerebral atherosclerosis: Secondary | ICD-10-CM | POA: Diagnosis not present

## 2023-09-20 DIAGNOSIS — M1991 Primary osteoarthritis, unspecified site: Secondary | ICD-10-CM | POA: Diagnosis not present

## 2023-09-20 DIAGNOSIS — E785 Hyperlipidemia, unspecified: Secondary | ICD-10-CM | POA: Diagnosis not present

## 2023-09-20 DIAGNOSIS — R7401 Elevation of levels of liver transaminase levels: Secondary | ICD-10-CM | POA: Diagnosis not present

## 2023-09-20 DIAGNOSIS — I10 Essential (primary) hypertension: Secondary | ICD-10-CM | POA: Diagnosis not present

## 2023-09-20 DIAGNOSIS — R29818 Other symptoms and signs involving the nervous system: Secondary | ICD-10-CM | POA: Diagnosis not present

## 2023-09-20 DIAGNOSIS — I498 Other specified cardiac arrhythmias: Secondary | ICD-10-CM | POA: Diagnosis not present

## 2023-09-20 DIAGNOSIS — I679 Cerebrovascular disease, unspecified: Secondary | ICD-10-CM | POA: Diagnosis not present

## 2023-09-20 DIAGNOSIS — M7552 Bursitis of left shoulder: Secondary | ICD-10-CM | POA: Diagnosis not present

## 2023-10-09 ENCOUNTER — Ambulatory Visit (INDEPENDENT_AMBULATORY_CARE_PROVIDER_SITE_OTHER): Payer: Medicare Other | Admitting: Neurology

## 2023-10-09 ENCOUNTER — Telehealth: Payer: Self-pay | Admitting: Gastroenterology

## 2023-10-09 ENCOUNTER — Encounter: Payer: Self-pay | Admitting: Neurology

## 2023-10-09 VITALS — BP 140/84 | HR 85 | Ht 63.0 in | Wt 198.0 lb

## 2023-10-09 DIAGNOSIS — I6340 Cerebral infarction due to embolism of unspecified cerebral artery: Secondary | ICD-10-CM

## 2023-10-09 DIAGNOSIS — G459 Transient cerebral ischemic attack, unspecified: Secondary | ICD-10-CM | POA: Diagnosis not present

## 2023-10-09 NOTE — Telephone Encounter (Signed)
Patient's daughter called back wanting to follow up for message below.  [phone]

## 2023-10-09 NOTE — Telephone Encounter (Signed)
Patient daughter states  medicare will no longer cover mesalamine. Please advise.   Please advise.   Thank you

## 2023-10-09 NOTE — Telephone Encounter (Addendum)
Arline Asp said that the apriso isn't covered and she said that on the paperwork it says her formulary the apriso is tier 3 is on it and mesalamine Er is tier 4. I told her that she would need to call the insurance company to see what exactly is covered since she got that letter and I will call the pharmacy because sometimes a PA is all we needed.   Urgent care pharmacy cindy and said that they only ran it for 30 day supply and not 90 day supply but it is paying with insurance and they paid 53.25 dollars for it and they can ask about the 90 when they are ready to fill but   Can't patient's daughter and told her this and told her that insurance is covered according to the pharmacy and that she can still call insurance to see and she voiced understanding

## 2023-10-09 NOTE — Progress Notes (Unsigned)
Follow-up Visit   Date: 10/09/2023    Emily Hancock MRN: [id number] DOB: 1940-11-30    Emily Hancock is a 83 y.o. right-handed Caucasian female with GERD, hyperlipidemia, hypertension, inflammatory bowel disease, migraines, depression and OA returning to the clinic for follow-up of stroke.  The patient was accompanied to the clinic by two daughters who also provides collateral information.    IMPRESSION/PLAN: TIA manifesting with expressive aphasia (December 2024).  MRI brain was negative for acute stroke. She completed dual antiplatelet therapy and now is on plavix 75mg  daily.  Discussed that if she were to have a future event, we may need to keep her on dual antiplatelet therapy.  She also has loop recorder.  Genetic testing for platelet disorders was inconclusive.   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 MRI reports suggestive multifocal stroke involving the left corona radiata/thalamocapsular region and dorsal pons. There was no LVO. No diabetes, tobacco use. BP is well-controlled.    PLAN: - Continue plavix 75mg  daily - Continue crestor 20mg  (LDL 56) and BP medications as per primary - She has loop recorder implanted  Return to clinic in 6 months  --------------------------------------------- History of present illness: She was admitted to [facility] 4/23 - 4/25 for altered mental status where imaging was suggestive of acute embolic appearing stroke (MRI was motion degraded). Family found her at home sitting on couch, confused, and not making sense. CTA did not show large vessel occlusion. There was no atrial fibrillation on telemetry. She was recommended to take plavix 75mg  and aspirin for 3 weeks, then plavix 75 daily. Crestor was increased to 20mg  daily. Family reports that word-finding difficulty resolved by the following day. She has been doing well at home and had no further speech changes, weakness, or  numbness/tingling. She had cardiac monitor and is scheduled to see cardiology next month.   UPDATE 10/09/2023: She was doing well until December 18th, when she developed acute onset of expressive aphasia.  She went to the ER where MRI brain was negative for acute stroke.  She was recommended to start dual antiplatelet therapy x 3 weeks and then continue plavix 75mg  , which she has done.  LDL 56, HbA1c 5.5.  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  No new symptoms.    Medications:  Current Outpatient Medications on File Prior to Visit  Medication Sig Dispense Refill   amoxicillin (AMOXIL) 500 MG capsule Take 2,000 mg by mouth as directed. Take 1 hour prior to dental visit     cholecalciferol (VITAMIN D3) 25 MCG (1000 UNIT) tablet Take 1,000 Units by mouth daily.     cloNIDine (CATAPRES) 0.1 MG tablet Take 1-2 tablets by mouth every morning.     cloNIDine (CATAPRES) 0.3 MG tablet Take 0.3 mg by mouth at bedtime.     clopidogrel (PLAVIX) 75 MG tablet Take 75 mg by mouth daily.     Coenzyme Q10 (CO Q 10 PO) Take 1 tablet by mouth daily.     Incontinence Supply Disposable (BLADDER CONTROL PAD REGULAR) MISC 1 tablet by Does not apply route daily.     mesalamine (APRISO) 0.375 g 24 hr capsule TAKE 4 CAPSULES BY MOUTH DAILY. 360 capsule 4   Multiple Vitamin (MULTIVITAMIN ADULT PO) Take 1 tablet by mouth daily.     pantoprazole (PROTONIX) 40 MG tablet Take 40 mg by mouth every other day.     rosuvastatin (CRESTOR) 20 MG tablet Take 20  mg by mouth at bedtime.     venlafaxine (EFFEXOR) 75 MG tablet Take 3 tablets by mouth daily.     No current facility-administered medications on file prior to visit.    Allergies:  Allergies  Allergen Reactions   Cefdinir Other (See Comments)    Nervous    Vital Signs:  BP (!) 140/84   Pulse 85   Ht 5\' 3"  (1.6 m)   Wt 198 lb (89.8 kg)   SpO2 98%   BMI 35.07 kg/m   Neurological Exam: MENTAL STATUS including orientation  to time, place, person, recent and remote memory, attention span and concentration, language, and fund of knowledge is normal.  Speech is not dysarthric.  CRANIAL NERVES:  No visual field defects.  Pupils equal round and reactive to light.  Normal conjugate, extra-ocular eye movements in all directions of gaze.  No ptosis.  Face is symmetric. Palate elevates symmetrically.  Tongue is midline.  MOTOR:  Motor strength is 5/5 in all extremities.  No atrophy, fasciculations or abnormal movements.  No pronator drift.  Tone is normal.    MSRs:  Reflexes are 2+/4 throughout.  SENSORY:  Intact to vibration throughout.  COORDINATION/GAIT:  Normal finger-to- nose-finger.  Intact rapid alternating movements bilaterally.  Gait assisted with cane, stable.   Data: MRI brain wo contrast 08/29/2023:  no acute process Labs 08/28/2024:  LDL 56, HbA1c 5.5, TSH 2.24, vitamin B12 515  Total time spent reviewing records, interview, history/exam, documentation, and coordination of care on day of encounter:  30 min    Thank you for allowing me to participate in patient's care.  If I can answer any additional questions, I would be pleased to do so.    Sincerely,    Darby Shadwick K. Allena Katz, DO

## 2023-10-11 ENCOUNTER — Telehealth: Payer: Self-pay

## 2023-10-11 ENCOUNTER — Other Ambulatory Visit (HOSPITAL_COMMUNITY): Payer: Self-pay

## 2023-10-11 NOTE — Telephone Encounter (Signed)
PA request has been Submitted. New Encounter created for follow up. For additional info see Pharmacy Prior Auth telephone encounter from 10/11/2023.

## 2023-10-11 NOTE — Telephone Encounter (Signed)
Pharmacy Patient Advocate Encounter   Received notification from CoverMyMeds that prior authorization for  Mesalamine ER 0.375 GM er capsules is required/requested.   Insurance verification completed.   The patient is insured through [facility] .   Per test claim: PA required; PA submitted to above mentioned insurance via CoverMyMeds Key/confirmation #/EOC [id number] Status is pending

## 2023-10-11 NOTE — Telephone Encounter (Signed)
Insurance rep called to advise Mesalamine medication requires PA

## 2023-10-12 ENCOUNTER — Ambulatory Visit: Payer: Medicare Other

## 2023-10-12 DIAGNOSIS — I6930 Unspecified sequelae of cerebral infarction: Secondary | ICD-10-CM | POA: Diagnosis not present

## 2023-10-15 ENCOUNTER — Other Ambulatory Visit (HOSPITAL_COMMUNITY): Payer: Self-pay

## 2023-10-15 LAB — CUP PACEART REMOTE DEVICE CHECK
Date Time Interrogation Session: 20250131212112
Implantable Pulse Generator Implant Date: 20241227

## 2023-10-15 NOTE — Telephone Encounter (Signed)
Pharmacy Patient Advocate Encounter  Received notification from [facility] that Prior Authorization for MESALAMINE 0.375MG  has been CANCELLED due to   PA #/Case ID/Reference #: [id number]    LAST FILLED ON 1.13.25. NEXT FILL IS ON 2.5.25.

## 2023-10-22 ENCOUNTER — Telehealth: Payer: Self-pay | Admitting: Gastroenterology

## 2023-10-22 MED ORDER — APRISO 0.375 G PO CP24: 1.5000 g | ORAL_CAPSULE | Freq: Every day | ORAL | 4 refills | Status: AC

## 2023-10-22 MED ORDER — APRISO 0.375 G PO CP24: 1.5000 g | ORAL_CAPSULE | Freq: Every day | ORAL | 4 refills | Status: DC

## 2023-10-22 NOTE — Telephone Encounter (Signed)
 Patient daughter called and stated that they received a letter of denial in the mail from the insurance company and was wondering if Dr. Venice Gillis has prescribed her a new medication instead of the mesalamine  since it was denied. Patient daughter stated that she would like that other medication to go to the Urgent Care Pharmacy in [place]. Patient daughter is requesting a call back. Please advise.

## 2023-10-22 NOTE — Telephone Encounter (Signed)
 Script discontinued at urgent care. They can't get it. Sent to [facility] as brand only. Emily Hancock was made aware I will do this since urgent care can't get the medication

## 2023-10-22 NOTE — Telephone Encounter (Signed)
 Script sent. They need to ask for the brand. I have talked with the daughter before about this

## 2023-11-07 NOTE — Telephone Encounter (Signed)
 Left message for pt daughter Arline Asp  to call back

## 2023-11-07 NOTE — Telephone Encounter (Signed)
 Inbound call from patient daughter requesting medication refill for pantoprazole. Also requesting to speak with a nurse. Please advise.

## 2023-11-07 NOTE — Telephone Encounter (Signed)
Patient's daughter returned call.

## 2023-11-08 ENCOUNTER — Other Ambulatory Visit: Payer: Self-pay

## 2023-11-08 DIAGNOSIS — K219 Gastro-esophageal reflux disease without esophagitis: Secondary | ICD-10-CM

## 2023-11-08 MED ORDER — PANTOPRAZOLE SODIUM 40 MG PO TBEC
40.0000 mg | DELAYED_RELEASE_TABLET | Freq: Two times a day (BID) | ORAL | 0 refills | Status: DC
Start: 2023-11-08 — End: 2024-08-05

## 2023-11-08 NOTE — Telephone Encounter (Signed)
 Pt daughter Arline Asp  requested that a prescription be sent to there new pharmacy Protonix 40 mg twice daily. Prescription was sent to pharmacy as requested for 2 months worth. Arline Asp made aware. Pt was also scheduled for an office visit on 01/04/2024 at 9:30 AM. With Dr. Chales Abrahams. Arline Asp made aware. Arline Asp  verbalized understanding with all questions answered.

## 2023-11-12 DIAGNOSIS — I672 Cerebral atherosclerosis: Secondary | ICD-10-CM | POA: Diagnosis not present

## 2023-11-12 DIAGNOSIS — K5190 Ulcerative colitis, unspecified, without complications: Secondary | ICD-10-CM | POA: Diagnosis not present

## 2023-11-12 DIAGNOSIS — E785 Hyperlipidemia, unspecified: Secondary | ICD-10-CM | POA: Diagnosis not present

## 2023-11-12 DIAGNOSIS — Z0000 Encounter for general adult medical examination without abnormal findings: Secondary | ICD-10-CM | POA: Diagnosis not present

## 2023-11-12 DIAGNOSIS — Z79899 Other long term (current) drug therapy: Secondary | ICD-10-CM | POA: Diagnosis not present

## 2023-11-12 DIAGNOSIS — K2960 Other gastritis without bleeding: Secondary | ICD-10-CM | POA: Diagnosis not present

## 2023-11-12 DIAGNOSIS — J432 Centrilobular emphysema: Secondary | ICD-10-CM | POA: Diagnosis not present

## 2023-11-12 DIAGNOSIS — I10 Essential (primary) hypertension: Secondary | ICD-10-CM | POA: Diagnosis not present

## 2023-11-12 DIAGNOSIS — I700 Atherosclerosis of aorta: Secondary | ICD-10-CM | POA: Diagnosis not present

## 2023-11-12 DIAGNOSIS — I498 Other specified cardiac arrhythmias: Secondary | ICD-10-CM | POA: Diagnosis not present

## 2023-11-12 DIAGNOSIS — I679 Cerebrovascular disease, unspecified: Secondary | ICD-10-CM | POA: Diagnosis not present

## 2023-11-12 DIAGNOSIS — G8194 Hemiplegia, unspecified affecting left nondominant side: Secondary | ICD-10-CM | POA: Diagnosis not present

## 2023-11-16 ENCOUNTER — Ambulatory Visit (INDEPENDENT_AMBULATORY_CARE_PROVIDER_SITE_OTHER): Payer: Medicare Other

## 2023-11-16 DIAGNOSIS — I6930 Unspecified sequelae of cerebral infarction: Secondary | ICD-10-CM | POA: Diagnosis not present

## 2023-11-16 NOTE — Addendum Note (Signed)
 Addended by: Elease Etienne A on: 11/16/2023 12:39 PM   Modules accepted: Orders

## 2023-11-16 NOTE — Progress Notes (Signed)
 Carelink Summary Report / Loop Recorder

## 2023-11-19 LAB — CUP PACEART REMOTE DEVICE CHECK
Date Time Interrogation Session: 20250307212109
Implantable Pulse Generator Implant Date: 20241227

## 2023-12-20 NOTE — Progress Notes (Signed)
 Carelink Summary Report / Loop Recorder

## 2023-12-20 NOTE — Addendum Note (Signed)
 Addended by: Elease Etienne A on: 12/20/2023 09:07 AM   Modules accepted: Orders

## 2023-12-21 ENCOUNTER — Ambulatory Visit (INDEPENDENT_AMBULATORY_CARE_PROVIDER_SITE_OTHER): Payer: Medicare Other

## 2023-12-21 DIAGNOSIS — I6930 Unspecified sequelae of cerebral infarction: Secondary | ICD-10-CM | POA: Diagnosis not present

## 2023-12-23 LAB — CUP PACEART REMOTE DEVICE CHECK
Date Time Interrogation Session: 20250411212112
Implantable Pulse Generator Implant Date: 20241227

## 2024-01-04 ENCOUNTER — Other Ambulatory Visit: Payer: Self-pay | Admitting: Gastroenterology

## 2024-01-04 ENCOUNTER — Other Ambulatory Visit (HOSPITAL_COMMUNITY): Payer: Self-pay

## 2024-01-04 ENCOUNTER — Encounter: Payer: Self-pay | Admitting: Gastroenterology

## 2024-01-04 ENCOUNTER — Telehealth: Payer: Self-pay

## 2024-01-04 ENCOUNTER — Ambulatory Visit: Payer: Medicare Other | Admitting: Gastroenterology

## 2024-01-04 VITALS — BP 120/70 | HR 72 | Ht 61.5 in | Wt 202.0 lb

## 2024-01-04 DIAGNOSIS — Z7902 Long term (current) use of antithrombotics/antiplatelets: Secondary | ICD-10-CM

## 2024-01-04 DIAGNOSIS — Z8673 Personal history of transient ischemic attack (TIA), and cerebral infarction without residual deficits: Secondary | ICD-10-CM | POA: Diagnosis not present

## 2024-01-04 DIAGNOSIS — K51519 Left sided colitis with unspecified complications: Secondary | ICD-10-CM

## 2024-01-04 DIAGNOSIS — R1310 Dysphagia, unspecified: Secondary | ICD-10-CM | POA: Diagnosis not present

## 2024-01-04 DIAGNOSIS — K219 Gastro-esophageal reflux disease without esophagitis: Secondary | ICD-10-CM | POA: Diagnosis not present

## 2024-01-04 MED ORDER — PANTOPRAZOLE SODIUM 40 MG PO TBEC: 40.0000 mg | DELAYED_RELEASE_TABLET | Freq: Every day | ORAL | 4 refills | Status: DC

## 2024-01-04 MED ORDER — MESALAMINE ER 0.375 G PO CP24: 1.5000 g | ORAL_CAPSULE | Freq: Every day | ORAL | 4 refills | Status: DC

## 2024-01-04 NOTE — Patient Instructions (Addendum)
 _______________________________________________________  If your blood pressure at your visit was 140/90 or greater, please contact your primary care physician to follow up on this.  _______________________________________________________  If you are age 82 or older, your body mass index should be between 23-30. Your Body mass index is 37.55 kg/m. If this is out of the aforementioned range listed, please consider follow up with your Primary Care Provider.  If you are age 60 or younger, your body mass index should be between 19-25. Your Body mass index is 37.55 kg/m. If this is out of the aformentioned range listed, please consider follow up with your Primary Care Provider.   ________________________________________________________  The [place] providers would like to encourage you to use MYCHART to communicate with providers for non-urgent requests or questions.  Due to long hold times on the telephone, sending your provider a message by [facility] may be a faster and more efficient way to get a response.  Please allow 48 business hours for a response.  Please remember that this is for non-urgent requests.  _______________________________________________________  Medications has been sent  Thank you,  Dr. Lajuan Pila

## 2024-01-04 NOTE — Telephone Encounter (Signed)
 Brand name Apriso  is covered. Quantity limit for 30 days at a time. Co-pay #120 per 30 days is $47.00

## 2024-01-04 NOTE — Progress Notes (Signed)
 Chief Complaint: FU  Referring Provider:  Street, Renford Cartwright, *     ASSESSMENT AND PLAN;   #1. 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 Now with recurrent dysphagia.  Barium swallow and EGD also showed multiple esophageal diverticula, large hiatal hernia. Resolved after dil 04/2022  #2. Quiescent left sided UC. Dx 2000. Colon 05/2018 showed mucosal healing.  #3.  Recent CVA on plavix  Plan: -Continue mesalamine  (Apriso )0.375g 4/day #360, 4 RF -Protonix  40mg  po every day #90, 4RF -Since her dysphagia symptoms are much better, she wants to hold off on repeat EGD with dilatation.  She will let us  know if she starts having any problems in future. -D/W pt and daughter.   HPI:    Maryssa Giampietro is a 83 y.o. female  CVA (Dr Lydia Sams) on Plavix, GERD, hyperlipidemia, hypertension, inflammatory bowel disease, migraines, depression and OA   For follow-up visit.  Discussed the use of AI scribe software for clinical note transcription with the patient, who gave verbal consent to proceed.  History of Present Illness [place] Carmack is an 83 year old female who presents for follow-up on swallowing difficulties and recent strokes.  She has swallowing difficulties, particularly following a previous esophageal dilation to 18 millimeters. Her swallowing is generally 'pretty good', but she experiences occasional dysphagia with certain foods, which sometimes regurgitate and are slow to transit. She has a known hiatal hernia and is taking pantoprazole  once a day for gastroesophageal reflux disease (GERD), with no significant heartburn or reflux symptoms.  She has experienced two strokes in the past year, one in April and another in December. The first stroke affected her speech, but she recovered by the end of the day without needing therapy. The second stroke was milder, with symptoms resolving by the time she reached the hospital. She is currently on Plavix for  stroke prevention, having previously been on aspirin and Plavix for a short period post-stroke. Her family mentions that she has had extensive blood work done recently, and she has a loop monitor inserted to rule out atrial fibrillation as a cause for her strokes.  Her history of ulcerative colitis, diagnosed in 2000, is currently managed with Apriso  (mesalamine ) four/day. She experiences occasional diarrhea, which her family attributes to dietary factors, but there is no blood in her stool. Her last colonoscopy showed inactive disease, and biopsies were taken at that time.   Accompanied by her daughter who takes good care of her.  No fever chills or night sweats.  No weight loss.    Past GI procedures/history:  EGD 12/23/2019 - Benign-appearing esophageal stenosis with esophageal diverticula. Dilated x 18 mm balloon. - Large hiatal hernia.  Dx with left-sided ulcerative colitis by Dr. Jeanie Miller 05/1999.  Has been treated with Apriso .  Most recent colonoscopy (see under procedures tab) 05/2018 with complete mucosal healing. 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   Past Medical History:  Diagnosis Date   Allergy    Asthma    Atypical squamous cells of undetermined significance (ASCUS) on Papanicolaou smear of cervix    C. difficile colitis    Cataract    bilateral   Chronic headache    COPD (chronic obstructive pulmonary disease) (HCC)    Depression    Dyslipidemia    Environmental and seasonal allergies    Family history of colon cancer  Mother-died at 53. 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 Distal esophageal diverticula. Presbyesophagus. Hiatal hernia. Gastric polyps (status post polypectomy x2)    Family History  Problem Relation Age of Onset   Colon cancer Mother        not definitive whether it was colon cancer   Lung cancer Father    Heart attack Father    Asthma Sister    Ulcerative colitis Daughter    Colon polyps Neg Hx    Rectal cancer Neg Hx    Stomach cancer Neg Hx    Esophageal cancer Neg Hx     Social History   Tobacco Use   Smoking status: Former    Current packs/day: 0.00    Types: Cigarettes    Quit date: 1994    Years since quitting: 31.3   Smokeless tobacco: Never  Vaping Use   Vaping status: Never Used  Substance Use Topics   Alcohol use: Yes    Comment: occasionally   Drug use: Never    Current Outpatient Medications  Medication Sig Dispense Refill   amoxicillin (AMOXIL) 500 MG capsule Take 2,000 mg by mouth as directed. Take 1 hour prior to dental visit     APRISO  0.375 g 24 hr capsule Take 4 capsules (1.5 g total) by mouth daily. 360 capsule 4   cholecalciferol (VITAMIN D3) 25 MCG (1000 UNIT) tablet Take 1,000 Units by mouth daily.     cloNIDine (CATAPRES) 0.1 MG tablet Take 1-2 tablets by mouth every morning.     cloNIDine (CATAPRES) 0.3 MG tablet Take 0.3 mg by mouth at bedtime.      clopidogrel (PLAVIX) 75 MG tablet Take 75 mg by mouth daily.     Coenzyme Q10 (CO Q 10 PO) Take 1 tablet by mouth daily.     Incontinence Supply Disposable (BLADDER CONTROL PAD REGULAR) MISC 1 tablet by Does not apply route daily.     mesalamine  (APRISO ) 0.375 g 24 hr capsule TAKE 4 CAPSULES BY MOUTH DAILY. 360 capsule 4   Multiple Vitamin (MULTIVITAMIN ADULT PO) Take 1 tablet by mouth daily.     pantoprazole  (PROTONIX ) 40 MG tablet Take 1 tablet (40 mg total) by mouth 2 (two) times daily. 120 tablet 0   rosuvastatin (CRESTOR) 20 MG tablet Take 20 mg by mouth at bedtime.     venlafaxine (EFFEXOR) 75 MG tablet Take 3 tablets by mouth daily.     No current facility-administered  medications for this visit.    Allergies  Allergen Reactions   Cefdinir Other (See Comments)    Nervous    Review of Systems:  neg     Physical Exam:    BP 120/70 (BP Location: Left Wrist, Patient Position: Sitting, Cuff Size: Large)   Pulse 72   Ht 5' 1.5" (1.562 m)   Wt 202 lb (91.6 kg)   BMI 37.55 kg/m  Wt Readings from Last 3 Encounters:  01/04/24 202 lb (91.6 kg)  10/09/23 198 lb (89.8 kg)  09/07/23 199 lb 6.4 oz (90.4 kg)   Constitutional:  Well-developed, in no acute distress. Psychiatric: Normal mood and affect. Behavior is normal. Abdominal: Soft, nondistended. Nontender. Bowel sounds active throughout. There are no masses palpable. No hepatomegaly. Neurological: Alert and oriented to person place and time. Skin: Skin is warm and dry. No rashes noted.    Magnus Schuller, MD 01/04/2024, 9:47 AM  Cc: Street, Renford Cartwright, *

## 2024-01-04 NOTE — Telephone Encounter (Signed)
 Pharmacy Patient Advocate Encounter   Received notification from RX Request Messages that prior authorization for Mesalamine  0.375 g 24hr capsule is required/requested.   Insurance verification completed.   The patient is insured through [employer] .   Per test claim:  BRAND Apriso  is preferred by the insurance.  If suggested medication is appropriate, Please send in a new RX and discontinue this one. If not, please advise as to why it's not appropriate so that we may request a Prior Authorization. Please note, some preferred medications may still require a PA.  If the suggested medications have not been trialed and there are no contraindications to their use, the PA will not be submitted, as it will not be approved.

## 2024-01-25 ENCOUNTER — Ambulatory Visit (INDEPENDENT_AMBULATORY_CARE_PROVIDER_SITE_OTHER): Payer: Medicare Other

## 2024-01-25 DIAGNOSIS — I6930 Unspecified sequelae of cerebral infarction: Secondary | ICD-10-CM

## 2024-01-28 NOTE — Addendum Note (Signed)
 Addended by: Lott Rouleau A on: 01/28/2024 08:27 AM   Modules accepted: Orders

## 2024-01-28 NOTE — Progress Notes (Signed)
 Carelink Summary Report / Loop Recorder

## 2024-01-31 ENCOUNTER — Ambulatory Visit: Payer: Self-pay | Admitting: Cardiology

## 2024-01-31 LAB — CUP PACEART REMOTE DEVICE CHECK
Date Time Interrogation Session: 20250516212111
Implantable Pulse Generator Implant Date: 20241227

## 2024-02-07 DIAGNOSIS — L84 Corns and callosities: Secondary | ICD-10-CM | POA: Insufficient documentation

## 2024-02-07 DIAGNOSIS — M216X1 Other acquired deformities of right foot: Secondary | ICD-10-CM | POA: Insufficient documentation

## 2024-02-07 DIAGNOSIS — M216X2 Other acquired deformities of left foot: Secondary | ICD-10-CM | POA: Diagnosis not present

## 2024-02-25 ENCOUNTER — Ambulatory Visit

## 2024-02-25 DIAGNOSIS — I6930 Unspecified sequelae of cerebral infarction: Secondary | ICD-10-CM

## 2024-02-25 DIAGNOSIS — J3089 Other allergic rhinitis: Secondary | ICD-10-CM | POA: Diagnosis not present

## 2024-02-25 DIAGNOSIS — I10 Essential (primary) hypertension: Secondary | ICD-10-CM | POA: Diagnosis not present

## 2024-02-25 DIAGNOSIS — M7552 Bursitis of left shoulder: Secondary | ICD-10-CM | POA: Diagnosis not present

## 2024-02-25 DIAGNOSIS — M1991 Primary osteoarthritis, unspecified site: Secondary | ICD-10-CM | POA: Diagnosis not present

## 2024-02-26 LAB — CUP PACEART REMOTE DEVICE CHECK
Date Time Interrogation Session: 20250616212135
Implantable Pulse Generator Implant Date: 20241227

## 2024-02-28 ENCOUNTER — Ambulatory Visit: Payer: Self-pay | Admitting: Cardiology

## 2024-02-28 NOTE — Progress Notes (Signed)
 Carelink Summary Report / Loop Recorder

## 2024-02-28 NOTE — Addendum Note (Signed)
 Addended by: Lott Rouleau A on: 02/28/2024 11:24 AM   Modules accepted: Orders

## 2024-03-27 ENCOUNTER — Ambulatory Visit (INDEPENDENT_AMBULATORY_CARE_PROVIDER_SITE_OTHER)

## 2024-03-27 DIAGNOSIS — I6930 Unspecified sequelae of cerebral infarction: Secondary | ICD-10-CM | POA: Diagnosis not present

## 2024-03-28 LAB — CUP PACEART REMOTE DEVICE CHECK
Date Time Interrogation Session: 20250717212303
Implantable Pulse Generator Implant Date: 20241227

## 2024-04-03 NOTE — Progress Notes (Signed)
 Carelink Summary Report / Loop Recorder

## 2024-04-03 NOTE — Addendum Note (Signed)
 Addended by: TAWNI DRILLING D on: 04/03/2024 04:13 PM   Modules accepted: Orders

## 2024-04-07 ENCOUNTER — Ambulatory Visit: Payer: Medicare Other | Admitting: Neurology

## 2024-04-07 ENCOUNTER — Encounter: Payer: Self-pay | Admitting: Neurology

## 2024-04-07 VITALS — BP 123/51 | HR 76 | Ht 61.5 in | Wt 204.0 lb

## 2024-04-07 DIAGNOSIS — G459 Transient cerebral ischemic attack, unspecified: Secondary | ICD-10-CM | POA: Diagnosis not present

## 2024-04-07 NOTE — Progress Notes (Signed)
 Follow-up Visit   Date: 04/07/2024    Emily Hancock MRN: [id number] DOB: Dec 26, 1940    Emily Hancock is a 83 y.o. right-handed Caucasian female with GERD, hyperlipidemia, hypertension, inflammatory bowel disease, migraines, depression and OA returning to the clinic for follow-up of stroke.  The patient was accompanied to the clinic by two daughters who also provides collateral information.    IMPRESSION/PLAN: TIA manifesting with expressive aphasia (December 2024).   MRI brain was negative for acute stroke. She completed dual antiplatelet therapy and transitioned to plavix 75mg  daily.  Genetic testing for platelet disorders was inconclusive.   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 MRI reports suggestive multifocal stroke involving the left corona radiata/thalamocapsular region and dorsal pons. There was no LVO. No diabetes, tobacco use. BP is well-controlled.    PLAN: - Continue plavix 75mg  daily - Continue crestor 20mg  (LDL 56) and BP medications as per primary - She has loop recorder implanted - If she has future event, she may need to be on dual antiplatelet therapy  Return to clinic as needed  --------------------------------------------- History of present illness: She was admitted to [facility] 4/23 - 4/25 for altered mental status where imaging was suggestive of acute embolic appearing stroke (MRI was motion degraded). Family found her at home sitting on couch, confused, and not making sense. CTA did not show large vessel occlusion. There was no atrial fibrillation on telemetry. She was recommended to take plavix 75mg  and aspirin for 3 weeks, then plavix 75 daily. Crestor was increased to 20mg  daily. Family reports that word-finding difficulty resolved by the following day. She has been doing well at home and had no further speech changes, weakness, or numbness/tingling. She had cardiac monitor and is scheduled  to see cardiology next month.   UPDATE 10/09/2023: She was doing well until December 18th, when she developed acute onset of expressive aphasia.  She went to the ER where MRI brain was negative for acute stroke.  She was recommended to start dual antiplatelet therapy x 3 weeks and then continue plavix 75mg  , which she has done.  LDL 56, HbA1c 5.5.  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  No new symptoms.    UPDATE 04/07/2024:  She is here for follow-up visit.  She has been doing well over the past several month and denies any new neurological symptoms or concerns.  She is compliant with her medications.  Loop recorder has not detected any afib.   Medications:  Current Outpatient Medications on File Prior to Visit  Medication Sig Dispense Refill   amoxicillin (AMOXIL) 500 MG capsule Take 2,000 mg by mouth as directed. Take 1 hour prior to dental visit     APRISO  0.375 g 24 hr capsule Take 4 capsules (1.5 g total) by mouth daily. (Patient taking differently: Take 1.5 g by mouth daily. Two daily) 360 capsule 4   cholecalciferol (VITAMIN D3) 25 MCG (1000 UNIT) tablet Take 1,000 Units by mouth daily.     cloNIDine (CATAPRES) 0.1 MG tablet Take 1-2 tablets by mouth every morning.     cloNIDine (CATAPRES) 0.3 MG tablet Take 0.3 mg by mouth at bedtime.     clopidogrel (PLAVIX) 75 MG tablet Take 75 mg by mouth daily.     Coenzyme Q10 (CO Q 10 PO) Take 1 tablet by mouth daily.     Incontinence Supply Disposable (BLADDER CONTROL PAD REGULAR) MISC 1 tablet by Does not apply  route daily.     Multiple Vitamin (MULTIVITAMIN ADULT PO) Take 1 tablet by mouth daily.     pantoprazole  (PROTONIX ) 40 MG tablet Take 1 tablet (40 mg total) by mouth daily. 90 tablet 4   rosuvastatin (CRESTOR) 20 MG tablet Take 20 mg by mouth at bedtime.     venlafaxine (EFFEXOR) 75 MG tablet Take 3 tablets by mouth daily.     mesalamine  (APRISO ) 0.375 g 24 hr capsule TAKE 4 CAPSULES BY MOUTH DAILY.  (Patient not taking: Reported on 04/07/2024) 360 capsule 4   mesalamine  (APRISO ) 0.375 g 24 hr capsule TAKE 4 CAPSULES BY MOUTH DAILY. (Patient not taking: Reported on 04/07/2024) 120 capsule 6   pantoprazole  (PROTONIX ) 40 MG tablet Take 1 tablet (40 mg total) by mouth 2 (two) times daily. (Patient not taking: Reported on 04/07/2024) 120 tablet 0   No current facility-administered medications on file prior to visit.    Allergies:  Allergies  Allergen Reactions   Cefdinir Other (See Comments)    Nervous    Vital Signs:  BP (!) 123/51   Pulse 76   Ht 5' 1.5 (1.562 m)   Wt 204 lb (92.5 kg)   SpO2 93%   BMI 37.92 kg/m   Neurological Exam: MENTAL STATUS including orientation to time, place, person, recent and remote memory, attention span and concentration, language, and fund of knowledge is normal.  Speech is not dysarthric.  CRANIAL NERVES:   Pupils equal round and reactive to light.  Normal conjugate, extra-ocular eye movements in all directions of gaze.  No ptosis.  Face is symmetric. Palate elevates symmetrically.  Tongue is midline.  MOTOR:  Motor strength is 5/5 in all extremities.  No atrophy, fasciculations or abnormal movements.  No pronator drift.  Tone is normal.    MSRs:  Reflexes are 2+/4 throughout.  SENSORY:  Intact to vibration throughout.  COORDINATION/GAIT:  Normal finger-to- nose-finger.  Intact rapid alternating movements bilaterally.  Gait assisted with cane, stable.   Data: MRI brain wo contrast 08/29/2023:  no acute process Labs 08/28/2024:  LDL 56, HbA1c 5.5, TSH 2.24, vitamin B12 515    Thank you for allowing me to participate in patient's care.  If I can answer any additional questions, I would be pleased to do so.    Sincerely,    Elzy Tomasello K. Tobie, DO

## 2024-04-09 ENCOUNTER — Ambulatory Visit: Payer: Self-pay | Admitting: Cardiology

## 2024-04-28 ENCOUNTER — Ambulatory Visit (INDEPENDENT_AMBULATORY_CARE_PROVIDER_SITE_OTHER)

## 2024-04-28 DIAGNOSIS — I6930 Unspecified sequelae of cerebral infarction: Secondary | ICD-10-CM

## 2024-04-29 LAB — CUP PACEART REMOTE DEVICE CHECK
Date Time Interrogation Session: 20250817212145
Implantable Pulse Generator Implant Date: 20241227

## 2024-05-01 ENCOUNTER — Ambulatory Visit: Payer: Self-pay | Admitting: Cardiology

## 2024-05-29 ENCOUNTER — Ambulatory Visit (INDEPENDENT_AMBULATORY_CARE_PROVIDER_SITE_OTHER)

## 2024-05-29 DIAGNOSIS — I6930 Unspecified sequelae of cerebral infarction: Secondary | ICD-10-CM

## 2024-05-29 LAB — CUP PACEART REMOTE DEVICE CHECK
Date Time Interrogation Session: 20250917212111
Implantable Pulse Generator Implant Date: 20241227

## 2024-06-02 ENCOUNTER — Ambulatory Visit: Payer: Self-pay | Admitting: Cardiology

## 2024-06-03 NOTE — Progress Notes (Signed)
 Remote Loop Recorder Transmission

## 2024-06-04 NOTE — Progress Notes (Signed)
 Remote Loop Recorder Transmission

## 2024-06-17 NOTE — Progress Notes (Signed)
 Remote Loop Recorder Transmission

## 2024-06-18 ENCOUNTER — Other Ambulatory Visit (HOSPITAL_COMMUNITY): Payer: Self-pay

## 2024-06-18 ENCOUNTER — Other Ambulatory Visit: Payer: Self-pay | Admitting: Gastroenterology

## 2024-06-18 MED ORDER — MESALAMINE ER 0.375 G PO CP24: 1.5000 g | ORAL_CAPSULE | Freq: Every day | ORAL | 4 refills | Status: DC

## 2024-06-18 NOTE — Addendum Note (Signed)
 Addended by: KATHIE BOTTCHER E on: 06/18/2024 03:08 PM   Modules accepted: Orders

## 2024-06-18 NOTE — Telephone Encounter (Signed)
 Looks like they are processing for generic. Brand name is preferred. No PA required, 30 day supply co-pay is $47.00

## 2024-06-18 NOTE — Telephone Encounter (Signed)
Script sent for brand name.

## 2024-06-19 DIAGNOSIS — M79675 Pain in left toe(s): Secondary | ICD-10-CM | POA: Insufficient documentation

## 2024-06-29 ENCOUNTER — Encounter

## 2024-06-30 ENCOUNTER — Ambulatory Visit

## 2024-06-30 DIAGNOSIS — I498 Other specified cardiac arrhythmias: Secondary | ICD-10-CM | POA: Diagnosis not present

## 2024-07-01 LAB — CUP PACEART REMOTE DEVICE CHECK
Date Time Interrogation Session: 20251019234243
Implantable Pulse Generator Implant Date: 20241227

## 2024-07-04 NOTE — Progress Notes (Signed)
 Remote Loop Recorder Transmission

## 2024-07-08 ENCOUNTER — Ambulatory Visit: Payer: Self-pay | Admitting: Cardiology

## 2024-07-30 ENCOUNTER — Encounter

## 2024-07-31 ENCOUNTER — Ambulatory Visit (INDEPENDENT_AMBULATORY_CARE_PROVIDER_SITE_OTHER)

## 2024-07-31 ENCOUNTER — Ambulatory Visit: Admitting: Cardiology

## 2024-07-31 DIAGNOSIS — I498 Other specified cardiac arrhythmias: Secondary | ICD-10-CM

## 2024-07-31 LAB — CUP PACEART REMOTE DEVICE CHECK
Date Time Interrogation Session: 20251119233411
Implantable Pulse Generator Implant Date: 20241227

## 2024-08-03 ENCOUNTER — Ambulatory Visit: Payer: Self-pay | Admitting: Cardiology

## 2024-08-04 ENCOUNTER — Telehealth: Payer: Self-pay | Admitting: Gastroenterology

## 2024-08-04 DIAGNOSIS — K219 Gastro-esophageal reflux disease without esophagitis: Secondary | ICD-10-CM

## 2024-08-04 NOTE — Telephone Encounter (Signed)
 Patient daughter requesting f/u call in regards to medication management. Please advise.

## 2024-08-04 NOTE — Progress Notes (Signed)
 Remote Loop Recorder Transmission

## 2024-08-04 NOTE — Telephone Encounter (Signed)
 LVM  Per Euna daughter requesting call to cause her insurance is changing in January and they need a different prescription  We will need patient insurance information if its not already in the system

## 2024-08-05 MED ORDER — PANTOPRAZOLE SODIUM 40 MG PO TBEC: 40.0000 mg | DELAYED_RELEASE_TABLET | Freq: Two times a day (BID) | ORAL | 0 refills | Status: DC

## 2024-08-05 NOTE — Telephone Encounter (Signed)
 Please increase Protonix  40 mg p.o. twice daily x 4 weeks If not better, let us  know RG

## 2024-08-05 NOTE — Telephone Encounter (Signed)
 Occ getting chocked when eating and coughing up phlem and it is a lot. Daughter Sari is wondering if she needs to come in to see about an EGD or if she needs to increase her Protonix  to 2 times a day? Please advise  Switching to [employer] told the daughter needs to give the information to the pharmacy and they should run it through to see if the medication is covered or not and the pharmacy will let us  know if its not

## 2024-08-05 NOTE — Telephone Encounter (Signed)
 Spoke to daughter and made aware. Sent to [facility]

## 2024-08-30 ENCOUNTER — Encounter

## 2024-08-31 ENCOUNTER — Ambulatory Visit

## 2024-08-31 DIAGNOSIS — I498 Other specified cardiac arrhythmias: Secondary | ICD-10-CM | POA: Diagnosis not present

## 2024-09-02 LAB — CUP PACEART REMOTE DEVICE CHECK
Date Time Interrogation Session: 20251220232752
Implantable Pulse Generator Implant Date: 20241227

## 2024-09-03 NOTE — Progress Notes (Signed)
 Remote Loop Recorder Transmission

## 2024-09-08 ENCOUNTER — Ambulatory Visit: Payer: Self-pay | Admitting: Cardiology

## 2024-09-08 ENCOUNTER — Ambulatory Visit: Attending: Cardiology | Admitting: Cardiology

## 2024-09-08 VITALS — BP 112/70 | HR 82 | Ht 61.5 in | Wt 210.8 lb

## 2024-09-08 DIAGNOSIS — I10 Essential (primary) hypertension: Secondary | ICD-10-CM | POA: Diagnosis not present

## 2024-09-08 DIAGNOSIS — I6930 Unspecified sequelae of cerebral infarction: Secondary | ICD-10-CM | POA: Diagnosis not present

## 2024-09-08 DIAGNOSIS — E785 Hyperlipidemia, unspecified: Secondary | ICD-10-CM | POA: Diagnosis not present

## 2024-09-08 DIAGNOSIS — G4733 Obstructive sleep apnea (adult) (pediatric): Secondary | ICD-10-CM

## 2024-09-08 NOTE — Patient Instructions (Addendum)
 Medication Instructions:  Your physician recommends that you continue on your current medications as directed. Please refer to the Current Medication list given to you today.  *If you need a refill on your cardiac medications before your next appointment, please call your pharmacy*   Lab Work: None Ordered If you have labs (blood work) drawn today and your tests are completely normal, you will receive your results only by: MyChart Message (if you have MyChart) OR A paper copy in the mail If you have any lab test that is abnormal or we need to change your treatment, we will call you to review the results.   Testing/Procedures: We will order CT coronary calcium score. It will cost $99.00 and is not covered by insurance.  Please call to schedule.    [facility] [address] [place], [place] [address] [phone]   Follow-Up: At [facility], you and your health needs are our priority.  As part of our continuing mission to provide you with exceptional heart care, we have created designated Provider Care Teams.  These Care Teams include your primary Cardiologist (physician) and Advanced Practice Providers (APPs -  Physician Assistants and Nurse Practitioners) who all work together to provide you with the care you need, when you need it.  We recommend signing up for the patient portal called "MyChart".  Sign up information is provided on this After Visit Summary.  MyChart is used to connect with patients for Virtual Visits (Telemedicine).  Patients are able to view lab/test results, encounter notes, upcoming appointments, etc.  Non-urgent messages can be sent to your provider as well.   To learn more about what you can do with MyChart, go to [email or web address].    Your next appointment:   6 month(s)  The format for your next appointment:   In Person  Provider:   Ralene Burger, MD    Other Instructions NA

## 2024-09-08 NOTE — Progress Notes (Unsigned)
 " Cardiology Office Note:    Date:  09/08/2024   ID:  Emily Hancock, DOB 18-Nov-1940, MRN [id number]  PCP:  Street, Lonni HERO, MD  Cardiologist:  Lamar Fitch, MD    Referring MD: Street, Lonni HERO, MD   No chief complaint on file. Doing fine  History of Present Illness:    Emily Hancock is a 83 y.o. female past medical history significant for CVA that she suffered from in December of last year then in December last year she had episode of TIA, luckily overall she recovered from all this.  No cardiac source of emboli identified, no evidence of intracardiac shunts, carotic ultrasound did not show any significant obstructive disease, she does have implantable loop recorder so far no evidence of atrial fibrillation or likely this year she had no symptoms.  No chest pain tightness squeezing pressure of her chest, she lives alone, however her family is watching very carefully over her.  In the matter-of-fact doctor's family members with her today in the office and they do participate in decision-making  Past Medical History:  Diagnosis Date   Allergy    Asthma    Atypical squamous cells of undetermined significance (ASCUS) on Papanicolaou smear of cervix    C. difficile colitis    Cataract    bilateral   Chronic headache    COPD (chronic obstructive pulmonary disease) (HCC)    Depression    Dyslipidemia    Environmental and seasonal allergies    Family history of colon cancer    Mother-died at 25. 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 Distal esophageal diverticula. Presbyesophagus. Hiatal hernia. Gastric polyps (status post polypectomy x2)    Current Medications: Active Medications[1]   Allergies:   Cefdinir   Social History   Socioeconomic History   Marital status: Divorced    Spouse name: Not on file   Number of children: Not on file   Years of education: Not on file   Highest education level: Not on file  Occupational History   Not on file  Tobacco Use   Smoking status: Former    Current packs/day: 0.00    Types: Cigarettes    Quit date: 53    Years since quitting: 32.0   Smokeless tobacco: Never  Vaping Use   Vaping status: Never Used  Substance and Sexual Activity   Alcohol use: Yes    Comment: occasionally   Drug use: Never   Sexual activity:  Not on file  Other Topics Concern   Not on file  Social History Narrative   Are you right handed or left handed? Right Handed    Are you currently employed ? No    What is your current occupation? None   Do you live at home alone? Yes. In a mobile home.    Who lives with you?    What type of home do you live in: 1 story or 2 story? One story mobile home.              Daughters- Sari Hagerty & Corean Staple       Social Drivers of Health   Tobacco Use: Medium Risk (04/07/2024)   Patient History    Smoking Tobacco Use: Former    Smokeless Tobacco Use: Never    Passive Exposure: Not on file  Financial Resource Strain: Not on file  Food Insecurity: Not on file  Transportation Needs: Not on  file  Physical Activity: Not on file  Stress: Not on file  Social Connections: Not on file  Depression (EYV7-0): Not on file  Alcohol Screen: Not on file  Housing: Not on file  Utilities: Not on file  Health Literacy: Not on file     Family History: The patient's family history includes Asthma in her sister; Colon cancer in her mother; Heart attack in her father; Lung cancer in her father; Ulcerative colitis in her daughter. There is no history of Colon polyps, Rectal cancer, Stomach cancer, or Esophageal cancer. ROS:   Please see the history of present illness.    All 14 point review of systems negative except as described per history of present illness  EKGs/Labs/Other Studies Reviewed:         Recent Labs: No results found for requested labs within last 365 days.  Recent Lipid Panel No results found for: CHOL, TRIG, HDL, CHOLHDL, VLDL, LDLCALC, LDLDIRECT  Physical Exam:    VS:  BP 112/70   Pulse 82   Ht 5' 1.5 (1.562 m)   Wt 210 lb 12.8 oz (95.6 kg)   SpO2 95%   BMI 39.19 kg/m     Wt Readings from Last 3 Encounters:  09/08/24 210 lb 12.8 oz (95.6 kg)  04/07/24 204 lb (92.5 kg)  01/04/24 202 lb (91.6 kg)     GEN:  Well nourished, well developed in no acute distress HEENT: Normal NECK: No JVD; No carotid bruits LYMPHATICS: No lymphadenopathy CARDIAC: RRR, no murmurs, no rubs, no gallops RESPIRATORY:  Clear to auscultation without rales, wheezing or rhonchi  ABDOMEN: Soft, non-tender, non-distended MUSCULOSKELETAL:  No edema; No deformity  SKIN: Warm and dry LOWER EXTREMITIES: no swelling NEUROLOGIC:  Alert and oriented x 3 PSYCHIATRIC:  Normal affect   ASSESSMENT:    1. Essential hypertension   2. Obstructive sleep apnea syndrome   3. Late effect of cerebrovascular accident (CVA)   4. Dyslipidemia    PLAN:    In order of problems listed above:  History of CVA/TIA, followed by neurology, on antiplatelets therapy will continue present  management she does have implantable loop recorder but no arrhythmia identified yet. Obstructive sleep apnea: That being followed by internal medicine team. Late effect of CVA, stable. Essential hypertension blood pressure is stable. Dyslipidemia she is taking Crestor 20, I will ask her to have calcium score to help determine how aggressive we want to be with management of this issue   Medication Adjustments/Labs and Tests Ordered: Current medicines are reviewed at  length with the patient today.  Concerns regarding medicines are outlined above.  No orders of the defined types were placed in this encounter.  Medication changes: No orders of the defined types were placed in this encounter.   Signed, Lamar DOROTHA Fitch, MD, [facility] 09/08/2024 1:49 PM    [facility]    [1]  Current Meds  Medication Sig   APRISO  0.375 g 24 hr capsule Take 4 capsules (1.5 g total) by mouth daily. (Patient taking differently: Take 1.5 g by mouth daily. Two daily)   cholecalciferol (VITAMIN D3) 25 MCG (1000 UNIT) tablet Take 1,000 Units by mouth daily.   cloNIDine (CATAPRES) 0.1 MG tablet Take 1-2 tablets by mouth every morning.   cloNIDine (CATAPRES) 0.3 MG tablet Take 0.3 mg by mouth at bedtime.   clopidogrel (PLAVIX) 75 MG tablet Take 75 mg by mouth daily.   clotrimazole-betamethasone (LOTRISONE) cream Apply 1 Application topically 2 (two) times daily.   Coenzyme Q10 (CO Q 10 PO) Take 1 tablet by mouth daily.   Incontinence Supply Disposable (BLADDER CONTROL PAD REGULAR) MISC 1 tablet by Does not apply route daily.   mesalamine  (APRISO ) 0.375 g 24 hr capsule Take 4 capsules (1.5 g total) by mouth daily.   Multiple Vitamin (MULTIVITAMIN ADULT PO) Take 1 tablet by mouth daily.   pantoprazole  (PROTONIX ) 40 MG tablet Take 1 tablet (40 mg total) by mouth 2 (two) times daily.   rosuvastatin (CRESTOR) 20 MG tablet Take 20 mg by mouth at bedtime.   venlafaxine XR (EFFEXOR-XR) 75 MG 24 hr  capsule Take 225 mg by mouth daily.   [DISCONTINUED] mesalamine  (APRISO ) 0.375 g 24 hr capsule Take 4 capsules (1.5 g total) by mouth daily.   "

## 2024-09-15 ENCOUNTER — Telehealth: Payer: Self-pay | Admitting: Gastroenterology

## 2024-09-15 NOTE — Telephone Encounter (Signed)
 Can we see about a PA for this medication since they changed insurance?

## 2024-09-15 NOTE — Telephone Encounter (Signed)
 Call from pt regarding rx refill mesalamine  (APRISO ) 0.375 g 24 hr capsule. Pt states she has new insurance and this medication is not covered. Pt requesting alternative. Please advise thank you

## 2024-09-16 ENCOUNTER — Other Ambulatory Visit (HOSPITAL_COMMUNITY): Payer: Self-pay

## 2024-09-16 NOTE — Telephone Encounter (Signed)
 Yes I spoke to the patients daughter. They have a little left of the medication but can't afford the 30 days supply and have tried [facility] as well and [facility]. They were quoted almost 200 dollars for a 30 supply

## 2024-09-16 NOTE — Telephone Encounter (Signed)
 Please advise to see if there is an alternative due to cost of medication

## 2024-09-16 NOTE — Telephone Encounter (Signed)
 Daughter cindy said she will check it out and give us  a call regarding it. She didn't want me to send a script   Mesalamine  is a tier 4 and they have to pay 25% of cost with insurance for the medication

## 2024-09-16 NOTE — Telephone Encounter (Signed)
 Generic mesalamine  capsules are covered. #120 per 30 days co-pay is $250.57 BranApriso  capsules are covered. #120 per 30 days co-pay is $482.90  Patient has either deductible, or out of pocket maximum, to meet first.

## 2024-09-22 ENCOUNTER — Ambulatory Visit (HOSPITAL_BASED_OUTPATIENT_CLINIC_OR_DEPARTMENT_OTHER)
Admission: RE | Admit: 2024-09-22 | Discharge: 2024-09-22 | Disposition: A | Payer: Self-pay | Source: Ambulatory Visit | Attending: Cardiology | Admitting: Cardiology

## 2024-09-22 DIAGNOSIS — E785 Hyperlipidemia, unspecified: Secondary | ICD-10-CM

## 2024-09-24 ENCOUNTER — Other Ambulatory Visit: Payer: Self-pay | Admitting: Gastroenterology

## 2024-09-24 DIAGNOSIS — K219 Gastro-esophageal reflux disease without esophagitis: Secondary | ICD-10-CM

## 2024-09-25 ENCOUNTER — Ambulatory Visit: Admitting: Cardiology

## 2024-09-29 ENCOUNTER — Ambulatory Visit: Payer: Self-pay | Admitting: Cardiology

## 2024-09-30 ENCOUNTER — Encounter

## 2024-09-30 ENCOUNTER — Telehealth: Payer: Self-pay

## 2024-09-30 NOTE — Telephone Encounter (Signed)
 Left message on My Chart with Ca Score results per Dr. Vanetta Shawl note. Routed to PCP.

## 2024-10-01 ENCOUNTER — Ambulatory Visit

## 2024-10-01 DIAGNOSIS — I498 Other specified cardiac arrhythmias: Secondary | ICD-10-CM

## 2024-10-02 ENCOUNTER — Telehealth: Payer: Self-pay

## 2024-10-02 ENCOUNTER — Ambulatory Visit: Payer: Self-pay | Admitting: Cardiology

## 2024-10-02 DIAGNOSIS — E785 Hyperlipidemia, unspecified: Secondary | ICD-10-CM

## 2024-10-02 LAB — CUP PACEART REMOTE DEVICE CHECK
Date Time Interrogation Session: 20260120233423
Implantable Pulse Generator Implant Date: 20241227

## 2024-10-02 NOTE — Telephone Encounter (Signed)
Lipid panel per Dr. Wendy Poet note.

## 2024-10-04 NOTE — Progress Notes (Signed)
 Remote Loop Recorder Transmission

## 2024-10-23 ENCOUNTER — Ambulatory Visit: Admitting: Gastroenterology

## 2024-10-31 ENCOUNTER — Encounter

## 2024-11-01 ENCOUNTER — Ambulatory Visit

## 2024-12-01 ENCOUNTER — Encounter

## 2024-12-02 ENCOUNTER — Ambulatory Visit

## 2025-01-01 ENCOUNTER — Encounter

## 2025-01-02 ENCOUNTER — Ambulatory Visit

## 2025-02-02 ENCOUNTER — Ambulatory Visit

## 2025-03-05 ENCOUNTER — Ambulatory Visit

## 2025-04-05 ENCOUNTER — Ambulatory Visit

## 2025-05-06 ENCOUNTER — Ambulatory Visit

## 2025-06-06 ENCOUNTER — Ambulatory Visit

## 2025-07-07 ENCOUNTER — Ambulatory Visit

## 2025-08-07 ENCOUNTER — Ambulatory Visit

## 2025-09-07 ENCOUNTER — Ambulatory Visit
